# Patient Record
Sex: Male | Born: 1980 | Race: Black or African American | Hispanic: No | Smoking: Current every day smoker
Health system: Southern US, Community
[De-identification: ages and names within clinical notes are randomized; demographics above are authoritative.]

## PROBLEM LIST (undated history)

## (undated) DIAGNOSIS — E785 Hyperlipidemia, unspecified: Secondary | ICD-10-CM

## (undated) DIAGNOSIS — I10 Essential (primary) hypertension: Secondary | ICD-10-CM

## (undated) DIAGNOSIS — K219 Gastro-esophageal reflux disease without esophagitis: Secondary | ICD-10-CM

## (undated) DIAGNOSIS — T7840XA Allergy, unspecified, initial encounter: Secondary | ICD-10-CM

## (undated) HISTORY — PX: ABDOMINAL SURGERY: SHX537

## (undated) HISTORY — DX: Hyperlipidemia, unspecified: E78.5

## (undated) HISTORY — DX: Essential (primary) hypertension: I10

## (undated) HISTORY — DX: Gastro-esophageal reflux disease without esophagitis: K21.9

## (undated) HISTORY — DX: Allergy, unspecified, initial encounter: T78.40XA

---

## 2001-05-02 ENCOUNTER — Emergency Department (HOSPITAL_COMMUNITY): Admission: EM | Admit: 2001-05-02 | Discharge: 2001-05-02 | Payer: Self-pay | Admitting: Emergency Medicine

## 2001-05-02 ENCOUNTER — Encounter: Payer: Self-pay | Admitting: Emergency Medicine

## 2004-06-12 ENCOUNTER — Emergency Department (HOSPITAL_COMMUNITY): Admission: EM | Admit: 2004-06-12 | Discharge: 2004-06-12 | Payer: Self-pay | Admitting: Emergency Medicine

## 2004-06-23 ENCOUNTER — Emergency Department (HOSPITAL_COMMUNITY): Admission: EM | Admit: 2004-06-23 | Discharge: 2004-06-23 | Payer: Self-pay | Admitting: Emergency Medicine

## 2005-11-17 ENCOUNTER — Emergency Department (HOSPITAL_COMMUNITY): Admission: EM | Admit: 2005-11-17 | Discharge: 2005-11-17 | Payer: Self-pay | Admitting: *Deleted

## 2007-08-07 ENCOUNTER — Emergency Department (HOSPITAL_COMMUNITY): Admission: EM | Admit: 2007-08-07 | Discharge: 2007-08-07 | Payer: Self-pay | Admitting: Emergency Medicine

## 2007-08-13 ENCOUNTER — Emergency Department (HOSPITAL_COMMUNITY): Admission: EM | Admit: 2007-08-13 | Discharge: 2007-08-13 | Payer: Self-pay | Admitting: Emergency Medicine

## 2007-09-05 ENCOUNTER — Emergency Department (HOSPITAL_COMMUNITY): Admission: EM | Admit: 2007-09-05 | Discharge: 2007-09-05 | Payer: Self-pay | Admitting: Emergency Medicine

## 2008-06-17 ENCOUNTER — Emergency Department (HOSPITAL_COMMUNITY): Admission: EM | Admit: 2008-06-17 | Discharge: 2008-06-17 | Payer: Self-pay | Admitting: Emergency Medicine

## 2013-12-19 ENCOUNTER — Emergency Department (HOSPITAL_COMMUNITY)
Admission: EM | Admit: 2013-12-19 | Discharge: 2013-12-19 | Disposition: A | Discharge status: Home / Self Care | Payer: No Typology Code available for payment source | Attending: Emergency Medicine | Admitting: Emergency Medicine

## 2013-12-19 ENCOUNTER — Encounter (HOSPITAL_COMMUNITY): Payer: Self-pay | Admitting: Emergency Medicine

## 2013-12-19 ENCOUNTER — Emergency Department (HOSPITAL_COMMUNITY): Payer: No Typology Code available for payment source

## 2013-12-19 DIAGNOSIS — Y92410 Unspecified street and highway as the place of occurrence of the external cause: Secondary | ICD-10-CM | POA: Insufficient documentation

## 2013-12-19 DIAGNOSIS — Y9389 Activity, other specified: Secondary | ICD-10-CM | POA: Insufficient documentation

## 2013-12-19 DIAGNOSIS — IMO0002 Reserved for concepts with insufficient information to code with codable children: Secondary | ICD-10-CM | POA: Insufficient documentation

## 2013-12-19 DIAGNOSIS — Z79899 Other long term (current) drug therapy: Secondary | ICD-10-CM | POA: Insufficient documentation

## 2013-12-19 DIAGNOSIS — F17200 Nicotine dependence, unspecified, uncomplicated: Secondary | ICD-10-CM | POA: Insufficient documentation

## 2013-12-19 MED ORDER — HYDROCODONE-ACETAMINOPHEN 5-325 MG PO TABS: 1.0000 | ORAL_TABLET | ORAL | Status: DC | PRN

## 2013-12-19 MED ORDER — CYCLOBENZAPRINE HCL 10 MG PO TABS: 10.0000 mg | ORAL_TABLET | Freq: Two times a day (BID) | ORAL | Status: DC | PRN

## 2013-12-19 MED ORDER — IBUPROFEN 600 MG PO TABS: 600.0000 mg | ORAL_TABLET | Freq: Four times a day (QID) | ORAL | Status: DC | PRN
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
## 2013-12-20 NOTE — ED Provider Notes (Signed)
Medical screening examination/treatment/procedure(s) were performed by non-physician practitioner and as supervising physician I was immediately available for consultation/collaboration.   EKG Interpretation None        Demitrios Molyneux L Deshonda Cryderman, MD 12/20/13 1350 

## 2014-04-17 ENCOUNTER — Emergency Department (HOSPITAL_COMMUNITY)
Admission: EM | Admit: 2014-04-17 | Discharge: 2014-04-17 | Disposition: A | Discharge status: Home / Self Care | Payer: No Typology Code available for payment source | Attending: Emergency Medicine | Admitting: Emergency Medicine

## 2014-04-17 ENCOUNTER — Encounter (HOSPITAL_COMMUNITY): Payer: Self-pay | Admitting: Emergency Medicine

## 2014-04-17 DIAGNOSIS — M545 Low back pain, unspecified: Secondary | ICD-10-CM | POA: Insufficient documentation

## 2014-04-17 DIAGNOSIS — Z79899 Other long term (current) drug therapy: Secondary | ICD-10-CM | POA: Insufficient documentation

## 2014-04-17 DIAGNOSIS — R52 Pain, unspecified: Secondary | ICD-10-CM | POA: Insufficient documentation

## 2014-04-17 DIAGNOSIS — F17200 Nicotine dependence, unspecified, uncomplicated: Secondary | ICD-10-CM | POA: Insufficient documentation

## 2014-04-17 MED ORDER — CYCLOBENZAPRINE HCL 10 MG PO TABS
10.0000 mg | ORAL_TABLET | Freq: Once | ORAL | Status: AC
  Administered 2014-04-17: 10 mg via ORAL
  Filled 2014-04-17: qty 1

## 2014-04-17 MED ORDER — CYCLOBENZAPRINE HCL 10 MG PO TABS: 10.0000 mg | ORAL_TABLET | Freq: Three times a day (TID) | ORAL | Status: DC | PRN

## 2014-04-17 MED ORDER — OXYCODONE-ACETAMINOPHEN 5-325 MG PO TABS
1.0000 | ORAL_TABLET | Freq: Once | ORAL | Status: AC
  Administered 2014-04-17: 1 via ORAL
  Filled 2014-04-17: qty 1

## 2014-04-17 MED ORDER — OXYCODONE-ACETAMINOPHEN 5-325 MG PO TABS: 1.0000 | ORAL_TABLET | ORAL | Status: DC | PRN

## 2014-04-17 MED ORDER — IBUPROFEN 800 MG PO TABS
800.0000 mg | ORAL_TABLET | Freq: Once | ORAL | Status: AC
  Administered 2014-04-17: 800 mg via ORAL
  Filled 2014-04-17: qty 1

## 2014-04-17 NOTE — Discharge Instructions (Signed)
Take two Naproxen (Aleve) tablets at a time, twice a day.  Back Pain, Adult Low back pain is very common. About 1 in 5 people have back pain.The cause of low back pain is rarely dangerous. The pain often gets better over time.About half of people with a sudden onset of back pain feel better in just 2 weeks. About 8 in 10 people feel better by 6 weeks.  CAUSES Some common causes of back pain include:  Strain of the muscles or ligaments supporting the spine.  Wear and tear (degeneration) of the spinal discs.  Arthritis.  Direct injury to the back. DIAGNOSIS Most of the time, the direct cause of low back pain is not known.However, back pain can be treated effectively even when the exact cause of the pain is unknown.Answering your caregiver's questions about your overall health and symptoms is one of the most accurate ways to make sure the cause of your pain is not dangerous. If your caregiver needs more information, he or she may order lab work or imaging tests (X-rays or MRIs).However, even if imaging tests show changes in your back, this usually does not require surgery. HOME CARE INSTRUCTIONS For many people, back pain returns.Since low back pain is rarely dangerous, it is often a condition that people can learn to [facility] their own.   Remain active. It is stressful on the back to sit or stand in one place. Do not sit, drive, or stand in one place for more than 30 minutes at a time. Take short walks on level surfaces as soon as pain allows.Try to increase the length of time you walk each day.  Do not stay in bed.Resting more than 1 or 2 days can delay your recovery.  Do not avoid exercise or work.Your body is made to move.It is not dangerous to be active, even though your back may hurt.Your back will likely heal faster if you return to being active before your pain is gone.  Pay attention to your body when you bend and lift. Many people have less discomfortwhen lifting if they  bend their knees, keep the load close to their bodies,and avoid twisting. Often, the most comfortable positions are those that put less stress on your recovering back.  Find a comfortable position to sleep. Use a firm mattress and lie on your side with your knees slightly bent. If you lie on your back, put a pillow under your knees.  Only take over-the-counter or prescription medicines as directed by your caregiver. Over-the-counter medicines to reduce pain and inflammation are often the most helpful.Your caregiver may prescribe muscle relaxant drugs.These medicines help dull your pain so you can more quickly return to your normal activities and healthy exercise.  Put ice on the injured area.  Put ice in a plastic bag.  Place a towel between your skin and the bag.  Leave the ice on for 15-20 minutes, 03-04 times a day for the first 2 to 3 days. After that, ice and heat may be alternated to reduce pain and spasms.  Ask your caregiver about trying back exercises and gentle massage. This may be of some benefit.  Avoid feeling anxious or stressed.Stress increases muscle tension and can worsen back pain.It is important to recognize when you are anxious or stressed and learn ways to manage it.Exercise is a great option. SEEK MEDICAL CARE IF:  You have pain that is not relieved with rest or medicine.  You have pain that does not improve in 1 week.  You have new symptoms.  You are generally not feeling well. SEEK IMMEDIATE MEDICAL CARE IF:   You have pain that radiates from your back into your legs.  You develop new bowel or bladder control problems.  You have unusual weakness or numbness in your arms or legs.  You develop nausea or vomiting.  You develop abdominal pain.  You feel faint. Document Released: 09/14/2005 Document Revised: 03/15/2012 Document Reviewed: 02/02/2011 [facility] Patient Information 2015 [place], [place]. This information is not intended to replace advice  given to you by your health care provider. Make sure you discuss any questions you have with your health care provider.  Cyclobenzaprine tablets What is this medicine? CYCLOBENZAPRINE (sye kloe BEN za preen) is a muscle relaxer. It is used to treat muscle pain, spasms, and stiffness. This medicine may be used for other purposes; ask your health care provider or pharmacist if you have questions. COMMON BRAND NAME(S): Fexmid, Flexeril What should I tell my health care provider before I take this medicine? They need to know if you have any of these conditions: -heart disease, irregular heartbeat, or previous heart attack -liver disease -thyroid problem -an unusual or allergic reaction to cyclobenzaprine, tricyclic antidepressants, lactose, other medicines, foods, dyes, or preservatives -pregnant or trying to get pregnant -breast-feeding How should I use this medicine? Take this medicine by mouth with a glass of water. Follow the directions on the prescription label. If this medicine upsets your stomach, take it with food or milk. Take your medicine at regular intervals. Do not take it more often than directed. Talk to your pediatrician regarding the use of this medicine in children. Special care may be needed. Overdosage: If you think you have taken too much of this medicine contact a poison control center or emergency room at once. NOTE: This medicine is only for you. Do not share this medicine with others. What if I miss a dose? If you miss a dose, take it as soon as you can. If it is almost time for your next dose, take only that dose. Do not take double or extra doses. What may interact with this medicine? Do not take this medicine with any of the following medications: -certain medicines for fungal infections like fluconazole, itraconazole, ketoconazole, posaconazole,  voriconazole -cisapride -dofetilide -dronedarone -droperidol -flecainide -grepafloxacin -halofantrine -levomethadyl -MAOIs like Carbex, Eldepryl, Marplan, Nardil, and Parnate -nilotinib -pimozide -probucol -sertindole -thioridazine -ziprasidone This medicine may also interact with the following medications: -abarelix -alcohol -certain medicines for cancer -certain medicines for depression, anxiety, or psychotic disturbances -certain medicines for infection like alfuzosin, chloroquine, clarithromycin, levofloxacin, mefloquine, pentamidine, troleandomycin -certain medicines for an irregular heart beat -certain medicines used for sleep or numbness during surgery or procedure -contrast dyes -dolasetron -guanethidine -methadone -octreotide -ondansetron -other medicines that prolong the QT interval (cause an abnormal heart rhythm) -palonosetron -phenothiazines like chlorpromazine, mesoridazine, prochlorperazine, thioridazine -tramadol -vardenafil This list may not describe all possible interactions. Give your health care provider a list of all the medicines, herbs, non-prescription drugs, or dietary supplements you use. Also tell them if you smoke, drink alcohol, or use illegal drugs. Some items may interact with your medicine. What should I watch for while using this medicine? Check with your doctor or health care professional if your condition does not improve within 1 to 3 weeks. You may get drowsy or dizzy when you first start taking the medicine or change doses. Do not drive, use machinery, or do anything that may be dangerous until you know how the medicine  affects you. Stand or sit up slowly. Your mouth may get dry. Drinking water, chewing sugarless gum, or sucking on hard candy may help. What side effects may I notice from receiving this medicine? Side effects that you should report to your doctor or health care professional as soon as possible: -allergic reactions like  skin rash, itching or hives, swelling of the face, lips, or tongue -chest pain -fast heartbeat -hallucinations -seizures -vomiting Side effects that usually do not require medical attention (report to your doctor or health care professional if they continue or are bothersome): -headache This list may not describe all possible side effects. Call your doctor for medical advice about side effects. You may report side effects to FDA at 1-800-FDA-1088. Where should I keep my medicine? Keep out of the reach of children. Store at room temperature between 15 and 30 degrees C (59 and 86 degrees F). Keep container tightly closed. Throw away any unused medicine after the expiration date. NOTE: This sheet is a summary. It may not cover all possible information. If you have questions about this medicine, talk to your doctor, pharmacist, or health care provider.  2015, Elsevier/Gold Standard. (2013-04-11 12:48:19)  Acetaminophen; Oxycodone tablets What is this medicine? ACETAMINOPHEN; OXYCODONE (a set a MEE noe fen; ox i KOE done) is a pain reliever. It is used to treat mild to moderate pain. This medicine may be used for other purposes; ask your health care provider or pharmacist if you have questions. COMMON BRAND NAME(S): Endocet, Magnacet, Narvox, Percocet, Perloxx, Primalev, Primlev, Roxicet, Xolox What should I tell my health care provider before I take this medicine? They need to know if you have any of these conditions: -brain tumor -Crohn's disease, inflammatory bowel disease, or ulcerative colitis -drug abuse or addiction -head injury -heart or circulation problems -if you often drink alcohol -kidney disease or problems going to the bathroom -liver disease -lung disease, asthma, or breathing problems -an unusual or allergic reaction to acetaminophen, oxycodone, other opioid analgesics, other medicines, foods, dyes, or preservatives -pregnant or trying to get  pregnant -breast-feeding How should I use this medicine? Take this medicine by mouth with a full glass of water. Follow the directions on the prescription label. Take your medicine at regular intervals. Do not take your medicine more often than directed. Talk to your pediatrician regarding the use of this medicine in children. Special care may be needed. Patients over 92 years old may have a stronger reaction and need a smaller dose. Overdosage: If you think you have taken too much of this medicine contact a poison control center or emergency room at once. NOTE: This medicine is only for you. Do not share this medicine with others. What if I miss a dose? If you miss a dose, take it as soon as you can. If it is almost time for your next dose, take only that dose. Do not take double or extra doses. What may interact with this medicine? -alcohol -antihistamines -barbiturates like amobarbital, butalbital, butabarbital, methohexital, pentobarbital, phenobarbital, thiopental, and secobarbital -benztropine -drugs for bladder problems like solifenacin, trospium, oxybutynin, tolterodine, hyoscyamine, and methscopolamine -drugs for breathing problems like ipratropium and tiotropium -drugs for certain stomach or intestine problems like propantheline, homatropine methylbromide, glycopyrrolate, atropine, belladonna, and dicyclomine -general anesthetics like etomidate, ketamine, nitrous oxide, propofol, desflurane, enflurane, halothane, isoflurane, and sevoflurane -medicines for depression, anxiety, or psychotic disturbances -medicines for sleep -muscle relaxants -naltrexone -narcotic medicines (opiates) for pain -phenothiazines like perphenazine, thioridazine, chlorpromazine, mesoridazine, fluphenazine, prochlorperazine, promazine, and trifluoperazine -scopolamine -  tramadol -trihexyphenidyl This list may not describe all possible interactions. Give your health care provider a list of all the  medicines, herbs, non-prescription drugs, or dietary supplements you use. Also tell them if you smoke, drink alcohol, or use illegal drugs. Some items may interact with your medicine. What should I watch for while using this medicine? Tell your doctor or health care professional if your pain does not go away, if it gets worse, or if you have new or a different type of pain. You may develop tolerance to the medicine. Tolerance means that you will need a higher dose of the medication for pain relief. Tolerance is normal and is expected if you take this medicine for a long time. Do not suddenly stop taking your medicine because you may develop a severe reaction. Your body becomes used to the medicine. This does NOT mean you are addicted. Addiction is a behavior related to getting and using a drug for a non-medical reason. If you have pain, you have a medical reason to take pain medicine. Your doctor will tell you how much medicine to take. If your doctor wants you to stop the medicine, the dose will be slowly lowered over time to avoid any side effects. You may get drowsy or dizzy. Do not drive, use machinery, or do anything that needs mental alertness until you know how this medicine affects you. Do not stand or sit up quickly, especially if you are an older patient. This reduces the risk of dizzy or fainting spells. Alcohol may interfere with the effect of this medicine. Avoid alcoholic drinks. There are different types of narcotic medicines (opiates) for pain. If you take more than one type at the same time, you may have more side effects. Give your health care provider a list of all medicines you use. Your doctor will tell you how much medicine to take. Do not take more medicine than directed. Call emergency for help if you have problems breathing. The medicine will cause constipation. Try to have a bowel movement at least every 2 to 3 days. If you do not have a bowel movement for 3 days, call your doctor or  health care professional. Do not take Tylenol (acetaminophen) or medicines that have acetaminophen with this medicine. Too much acetaminophen can be very dangerous. Many nonprescription medicines contain acetaminophen. Always read the labels carefully to avoid taking more acetaminophen. What side effects may I notice from receiving this medicine? Side effects that you should report to your doctor or health care professional as soon as possible: -allergic reactions like skin rash, itching or hives, swelling of the face, lips, or tongue -breathing difficulties, wheezing -confusion -light headedness or fainting spells -severe stomach pain -unusually weak or tired -yellowing of the skin or the whites of the eyes Side effects that usually do not require medical attention (report to your doctor or health care professional if they continue or are bothersome): -dizziness -drowsiness -nausea -vomiting This list may not describe all possible side effects. Call your doctor for medical advice about side effects. You may report side effects to FDA at 1-800-FDA-1088. Where should I keep my medicine? Keep out of the reach of children. This medicine can be abused. Keep your medicine in a safe place to protect it from theft. Do not share this medicine with anyone. Selling or giving away this medicine is dangerous and against the law. Store at room temperature between 20 and 25 degrees C (68 and 77 degrees F). Keep container tightly closed.  Protect from light. This medicine may cause accidental overdose and death if it is taken by other adults, children, or pets. Flush any unused medicine down the toilet to reduce the chance of harm. Do not use the medicine after the expiration date. NOTE: This sheet is a summary. It may not cover all possible information. If you have questions about this medicine, talk to your doctor, pharmacist, or health care provider.  2015, Elsevier/Gold Standard. (2013-05-08  13:17:35)   Emergency Department Resource Guide 1) Find a Doctor and Pay Out of Pocket Although you won't have to find out who is covered by your insurance plan, it is a good idea to ask around and get recommendations. You will then need to call the office and see if the doctor you have chosen will accept you as a new patient and what types of options they offer for patients who are self-pay. Some doctors offer discounts or will set up payment plans for their patients who do not have insurance, but you will need to ask so you aren't surprised when you get to your appointment.  2) Contact Your Local Health Department Not all health departments have doctors that can see patients for sick visits, but many do, so it is worth a call to see if yours does. If you don't know where your local health department is, you can check in your phone book. The CDC also has a tool to help you locate your state's health department, and many state websites also have listings of all of their local health departments.  3) Find a Walk-in Clinic If your illness is not likely to be very severe or complicated, you may want to try a walk in clinic. These are popping up all over the country in pharmacies, drugstores, and shopping centers. They're usually staffed by nurse practitioners or physician assistants that have been trained to treat common illnesses and complaints. They're usually fairly quick and inexpensive. However, if you have serious medical issues or chronic medical problems, these are probably not your best option.  No Primary Care Doctor: - Call Health Connect at  [phone] - they can help you locate a primary care doctor that  accepts your insurance, provides certain services, etc. - Physician Referral [phone]  Medication Assistance: Organization         Address  Phone   Notes  [facility] Medication Assistance Program [address] [place] [place], [place] [address] [phone]  --Must be a resident of [facility] -- Must have NO insurance coverage whatsoever (no Medicaid/ Medicare, etc.) -- The pt. MUST have a primary care doctor that directs their care regularly and follows them in the community   [facility]ist  [phone]   [employer]  [phone]    Agencies that provide inexpensive medical care: Organization         Address  Phone   Notes  [facility]  [phone]   [facility]    [phone]   [facility] [address] [place], [place] [address] [phone]   [facility] [address] [place] [place] [phone]   Planned Parenthood    [phone]   [facility]    [phone]   [facility]  [address], [place] Phone:  [phone], Fax:  [phone] Hours of Operation:  9 am - 6 pm, M-F.  Also accepts Medicaid/Medicare and  self-pay.  [facility] for [place] [address], [address], [place] Phone: [phone], Fax: [phone]. Hours of Operation:  8:30 am - 5:30 pm, M-F.  Also accepts Medicaid and self-pay.  [place] [address], [place] Phone: [phone]   [place], [place], [place] [phone], Ext. [phone] Mondays & Thursdays: 7-9 AM.  First 15 patients are seen on a first come, first serve basis.    [place] Providers:  Organization         Address  Phone   Notes  [facility] [address], [place] [phone] Also accepts self-pay patients.  [facility] [address], [place]  [phone]   [place], [address], [place] [phone]   [facility] [address] [place] [phone]   Lucianne Lei [address], [place]   [phone] Only  accepts [place] Access [place] patients after they have their name applied to their card.   Self-Pay (no insurance) in [facility]:  Organization         Address  Phone   Notes  Sickle Cell Patients, [facility] [place] [phone]   [facility] [place] [phone]   [facility] [place]  [place], [place], [place] [phone]   [facility]/Dr. Osei-Bonsu  [address] [place] or [address], [place] [phone] Phone number for both [place] and [place] locations is the same.  [facility] [address], [place] [phone]   [facility] [address], [place] or [address] [phone]   [facility] [address], [place] [phone], phone; [phone], fax Sees patients 1st and 3rd Saturday of every month.  Must not qualify for public or private insurance (i.e. Medicaid, Medicare, [place] Health Choice, Veterans' Benefits)  Household income should be no more than 200% of the poverty level The clinic cannot treat you if you are pregnant or think you are pregnant  Sexually transmitted diseases are not treated at the clinic.    Dental Care: Organization         Address  Phone  Notes  [facility] Department of [facility] [phone] Accepts children up to age 77 who are enrolled in [place] or [place]; pregnant women with a Medicaid card; and children who have applied for Medicaid or [place] Health Choice, but were declined, whose parents can pay a reduced fee at time of service.  [facility] Department of [facility]  [address], [place] [phone] Accepts children up to age 9 who are enrolled in [place] or [place]; pregnant  women with a Medicaid card; and children who have applied for Medicaid or [place] Health Choice, but were declined, whose parents can pay a reduced fee at time of service.  [place] [facility] PRO[facility]  [place] [phone] Patients are seen by appointment only. Walk-ins are not accepted. [place] will see patients 28 years of age and older. Monday - Tuesday (8am-5pm) Most Wednesdays (8:30-5pm) $  30 per visit, cash only  [facility] Adult [employer] PROGRAM  [address], [facility] [phone] Patients are seen by appointment only. Walk-ins are not accepted. [facility] will see patients 31 years of age and older. One Wednesday Evening (Monthly: Volunteer Based).  $30 per visit, cash only  [employer]  [phone] for adults; Children under age 95, call Graduate Pediatric Dentistry at [phone]. Children aged 95-14, please call [phone] to request a pediatric application.  Dental services are provided in all areas of dental care including fillings, crowns and bridges, complete and partial dentures, implants, gum treatment, root canals, and extractions. Preventive care is also provided. Treatment is provided to both adults and children. Patients are selected via a lottery and there is often a waiting list.   [facility] [address] [place]  [phone] [email or web address]   [facility] [address] [place], [place] [phone], Ext. [phone] Second and Fourth Thursday of each month, opens at 6:30 AM; Clinic ends at 9 AM.  Patients are seen on a first-come first-served basis, and a limited number are seen during each clinic.   [facility]  [address] [place], [place] [phone]   Eligibility Requirements You must have lived in [place], [place], or [place] counties for at least the last three months.   You cannot be eligible for state or federal sponsored  [place], including [employer], [place], or [employer].   You generally cannot be eligible for healthcare insurance through your employer.    How to apply: Eligibility screenings are held every Tuesday and Wednesday afternoon from 1:00 pm until 4:00 pm. You do not need an appointment for the interview!  [facility] [address] [place], [place] [phone]   [facility]  [phone]   [facility] Health Department  [phone]   [facility] Health Department  [phone]

## 2014-04-17 NOTE — ED Provider Notes (Signed)
CSN: [id number]     Arrival date & time 04/17/14  0522 History   First MD Initiated Contact with Patient 04/17/14 [id number]     Chief Complaint  Patient presents with  . Back Pain     (Consider location/radiation/quality/duration/timing/severity/associated sxs/prior Treatment) Patient is a 33 y.o. male presenting with back pain. The history is provided by the patient.  Back Pain He has been having pain in his lower back for the last 3 days. Pain is in the midline but radiates to both hips and small size. Pain is severe and he rates it 10/10. It is worse with any movement or bending. He denies any recent trauma or unusual bending or lifting. He has had back problem for the past but not for several years. He denies bowel or bladder dysfunction. He denies weakness, numbness, tingling. He has tried taking ibuprofen as well as a leftover cyclobenzaprine which have not given him any relief. He was unable to sleep tonight she came to the ED.  History reviewed. No pertinent past medical history. Past Surgical History  Procedure Laterality Date  . Abdominal surgery     History reviewed. No pertinent family history. History  Substance Use Topics  . Smoking status: Current Every Day Smoker    Types: Cigarettes  . Smokeless tobacco: Not on file  . Alcohol Use: No    Review of Systems  Musculoskeletal: Positive for back pain.  All other systems reviewed and are negative.     Allergies  Review of patient's allergies indicates no known allergies.  Home Medications   Prior to Admission medications   Medication Sig Start Date End Date Taking? Authorizing Provider  cetirizine (ZYRTEC) 10 MG tablet Take 10 mg by mouth daily.    Historical Provider, MD  cyclobenzaprine (FLEXERIL) 10 MG tablet Take 1 tablet (10 mg total) by mouth 2 (two) times daily as needed for muscle spasms. 12/19/13   Tiffany Irine Seal, PA-C  cyclobenzaprine (FLEXERIL) 10 MG tablet Take 1 tablet (10 mg total) by mouth 3 (three)  times daily as needed for muscle spasms. 04/17/14   Dione Booze, MD  HYDROcodone-acetaminophen (NORCO/VICODIN) 5-325 MG per tablet Take 1 tablet by mouth every 4 (four) hours as needed. 12/19/13   Tiffany Irine Seal, PA-C  ibuprofen (ADVIL,MOTRIN) 600 MG tablet Take 1 tablet (600 mg total) by mouth every 6 (six) hours as needed. 12/19/13   Tiffany Irine Seal, PA-C  omeprazole (PRILOSEC) 20 MG capsule Take 20 mg by mouth daily.    Historical Provider, MD  oxyCODONE-acetaminophen (PERCOCET/ROXICET) 5-325 MG per tablet Take 1 tablet by mouth every 4 (four) hours as needed. 04/17/14   Dione Booze, MD   BP 115/82  Pulse 99  Temp(Src) 97.8 F (36.6 C) (Oral)  Resp 21  Ht 6' (1.829 m)  Wt 256 lb (116.121 kg)  BMI 34.71 kg/m2  SpO2 99% Physical Exam  Nursing note and vitals reviewed.  33 year old male, resting comfortably and in no acute distress. Vital signs are normal. Oxygen saturation is 99%, which is normal. Head is normocephalic and atraumatic. PERRLA, EOMI. Oropharynx is clear. Neck is nontender and supple without adenopathy or JVD. Back is moderately tender in the mid and lower lumbar area. There is moderate to severe bilateral paralumbar spasm. Straight leg raise is positive bilaterally at 30. There is no CVA tenderness. Lungs are clear without rales, wheezes, or rhonchi. Chest is nontender. Heart has regular rate and rhythm without murmur. Abdomen is soft, flat, nontender without masses  or hepatosplenomegaly and peristalsis is normoactive. Extremities have no cyanosis or edema, full range of motion is present. Skin is warm and dry without rash. Neurologic: Mental status is normal, cranial nerves are intact, there are no motor or sensory deficits.   ED Course  Procedures (including critical care time)  MDM   Final diagnoses:  Acute low back pain    No acute low faxed pain. No evidence of any acute neurologic injury. No indication for imaging studies today. He is discharged with  prescriptions for cyclobenzaprine and oxycodone and acetaminophen as told to use over-the-counter naproxen.    Dione Boozeavid Riely Oetken, MD 04/17/14 0700

## 2014-04-17 NOTE — ED Notes (Signed)
Pt alert & oriented x4, stable gait. Patient given discharge instructions, paperwork & prescription(s). Patient  instructed to stop at the registration desk to finish any additional paperwork. Patient verbalized understanding. Pt left department w/ no further questions. 

## 2014-04-17 NOTE — ED Notes (Signed)
Pt c/o lower back pain since Sat.

## 2016-02-07 ENCOUNTER — Other Ambulatory Visit (HOSPITAL_COMMUNITY): Payer: Self-pay | Admitting: Family

## 2016-02-07 DIAGNOSIS — M545 Low back pain, unspecified: Secondary | ICD-10-CM

## 2016-02-14 ENCOUNTER — Ambulatory Visit (HOSPITAL_COMMUNITY)
Admission: RE | Admit: 2016-02-14 | Discharge: 2016-02-14 | Disposition: A | Discharge status: Home / Self Care | Payer: No Typology Code available for payment source | Source: Ambulatory Visit | Attending: Family | Admitting: Family

## 2016-02-14 DIAGNOSIS — M545 Low back pain, unspecified: Secondary | ICD-10-CM

## 2016-02-14 DIAGNOSIS — M4806 Spinal stenosis, lumbar region: Secondary | ICD-10-CM | POA: Insufficient documentation

## 2016-02-14 DIAGNOSIS — M5441 Lumbago with sciatica, right side: Secondary | ICD-10-CM | POA: Insufficient documentation

## 2016-02-14 DIAGNOSIS — M5137 Other intervertebral disc degeneration, lumbosacral region: Secondary | ICD-10-CM | POA: Insufficient documentation

## 2016-02-14 DIAGNOSIS — M5442 Lumbago with sciatica, left side: Secondary | ICD-10-CM | POA: Insufficient documentation

## 2016-02-14 DIAGNOSIS — M5127 Other intervertebral disc displacement, lumbosacral region: Secondary | ICD-10-CM | POA: Insufficient documentation

## 2017-08-20 ENCOUNTER — Emergency Department (HOSPITAL_COMMUNITY)
Admission: EM | Admit: 2017-08-20 | Discharge: 2017-08-20 | Disposition: A | Discharge status: Home / Self Care | Payer: BLUE CROSS/BLUE SHIELD | Attending: Emergency Medicine | Admitting: Emergency Medicine

## 2017-08-20 ENCOUNTER — Other Ambulatory Visit: Payer: Self-pay

## 2017-08-20 ENCOUNTER — Encounter (HOSPITAL_COMMUNITY): Payer: Self-pay | Admitting: Emergency Medicine

## 2017-08-20 ENCOUNTER — Emergency Department (HOSPITAL_COMMUNITY): Payer: BLUE CROSS/BLUE SHIELD

## 2017-08-20 DIAGNOSIS — Z79899 Other long term (current) drug therapy: Secondary | ICD-10-CM | POA: Diagnosis not present

## 2017-08-20 DIAGNOSIS — Y9389 Activity, other specified: Secondary | ICD-10-CM | POA: Diagnosis not present

## 2017-08-20 DIAGNOSIS — W25XXXA Contact with sharp glass, initial encounter: Secondary | ICD-10-CM | POA: Insufficient documentation

## 2017-08-20 DIAGNOSIS — Y998 Other external cause status: Secondary | ICD-10-CM | POA: Insufficient documentation

## 2017-08-20 DIAGNOSIS — S61411A Laceration without foreign body of right hand, initial encounter: Secondary | ICD-10-CM | POA: Diagnosis present

## 2017-08-20 DIAGNOSIS — S66821A Laceration of other specified muscles, fascia and tendons at wrist and hand level, right hand, initial encounter: Secondary | ICD-10-CM | POA: Insufficient documentation

## 2017-08-20 DIAGNOSIS — F17210 Nicotine dependence, cigarettes, uncomplicated: Secondary | ICD-10-CM | POA: Insufficient documentation

## 2017-08-20 DIAGNOSIS — Y929 Unspecified place or not applicable: Secondary | ICD-10-CM | POA: Insufficient documentation

## 2017-08-20 MED ORDER — LIDOCAINE HCL 2 % IJ SOLN
10.0000 mL | Freq: Once | INTRAMUSCULAR | Status: AC
  Administered 2017-08-20: 200 mg
  Filled 2017-08-20: qty 20

## 2017-08-20 MED ORDER — IBUPROFEN 400 MG PO TABS
600.0000 mg | ORAL_TABLET | Freq: Once | ORAL | Status: AC
  Administered 2017-08-20: 600 mg via ORAL
  Filled 2017-08-20: qty 1

## 2017-08-20 MED ORDER — LIDOCAINE HCL 2 % IJ SOLN
15.0000 mL | Freq: Once | INTRAMUSCULAR | Status: DC
  Filled 2017-08-20: qty 20

## 2017-08-20 MED ORDER — SULFAMETHOXAZOLE-TRIMETHOPRIM 800-160 MG PO TABS
1.0000 | ORAL_TABLET | Freq: Two times a day (BID) | ORAL | 0 refills | Status: AC
Start: 2017-08-20 — End: 2017-08-27

## 2017-08-20 NOTE — ED Provider Notes (Signed)
[facility] EMERGENCY DEPARTMENT Provider Note   CSN: [id number] Arrival date & time: 08/20/17  8295     History   Chief Complaint Chief Complaint  Patient presents with  . Extremity Laceration    HPI Shane Pena is a 36 y.o. male.  HPI   Patient is a 36 year old male with no significant past medical history presenting for laceration to the dorsum of right hand.  Additionally, patient had a laceration to the flexor surface of the right little finger, and some abrasions to the left forearm.  Patient reports that this occurred around 1030 or 11 PM last night.  Patient reports he was running at a family gathering and tripped over a cord and his hand went through a window.  Patient soaked his hand immediately afterwards.  Ibuprofen and Tylenol for the pain.  Patient reports he never lost sensation to the distal fingertips of the right hand.  Tetanus is up-to-date per patient's work place 2 years ago. Patient is not diabetic.  History reviewed. No pertinent past medical history.  There are no active problems to display for this patient.   Past Surgical History:  Procedure Laterality Date  . ABDOMINAL SURGERY         Home Medications    Prior to Admission medications   Medication Sig Start Date End Date Taking? Authorizing Provider  cetirizine (ZYRTEC) 10 MG tablet Take 10 mg by mouth daily.    [provider]  cyclobenzaprine (FLEXERIL) 10 MG tablet Take 1 tablet (10 mg total) by mouth 2 (two) times daily as needed for muscle spasms. 12/19/13   Marlon Pel, PA-C  cyclobenzaprine (FLEXERIL) 10 MG tablet Take 1 tablet (10 mg total) by mouth 3 (three) times daily as needed for muscle spasms. 04/17/14   Dione Booze, MD  HYDROcodone-acetaminophen (NORCO/VICODIN) 5-325 MG per tablet Take 1 tablet by mouth every 4 (four) hours as needed. 12/19/13   Marlon Pel, PA-C  ibuprofen (ADVIL,MOTRIN) 600 MG tablet Take 1 tablet (600 mg total) by mouth  every 6 (six) hours as needed. 12/19/13   Marlon Pel, PA-C  omeprazole (PRILOSEC) 20 MG capsule Take 20 mg by mouth daily.    [provider]  oxyCODONE-acetaminophen (PERCOCET/ROXICET) 5-325 MG per tablet Take 1 tablet by mouth every 4 (four) hours as needed. 04/17/14   Dione Booze, MD  sulfamethoxazole-trimethoprim (BACTRIM DS,SEPTRA DS) 800-160 MG tablet Take 1 tablet by mouth 2 (two) times daily for 7 days. 08/20/17 08/27/17  Elisha Ponder, PA-C    Family History History reviewed. No pertinent family history.  Social History Social History   Tobacco Use  . Smoking status: Current Every Day Smoker    Types: Cigarettes  . Smokeless tobacco: Never Used  Substance Use Topics  . Alcohol use: No  . Drug use: No     Allergies   Patient has no known allergies.   Review of Systems Review of Systems  Gastrointestinal: Negative for nausea and vomiting.  Skin: Positive for wound.  Neurological: Negative for syncope, weakness and numbness.     Physical Exam Updated Vital Signs BP (!) 137/92 (BP Location: Left Arm)   Pulse 82   Temp 98.5 F (36.9 C) (Oral)   Resp 18   Ht 6' (1.829 m)   Wt 108.9 kg (240 lb)   SpO2 98%   BMI 32.55 kg/m   Physical Exam  Constitutional: He appears well-developed and well-nourished. No distress.  Sitting comfortably in bed.  HENT:  Head:  Normocephalic and atraumatic.  Eyes: Conjunctivae are normal. Right eye exhibits no discharge. Left eye exhibits no discharge.  EOMs normal to gross examination.  Neck: Normal range of motion.  Cardiovascular: Normal rate and regular rhythm.  Intact, 2+ radial and ulnar pulse on right.  Pulmonary/Chest:  Normal respiratory effort. Patient converses comfortably. No audible wheeze or stridor.  Abdominal: He exhibits no distension.  Musculoskeletal: Normal range of motion.  Hand Exam:  Inspection: No swelling, deformity.  There is a wound to the dorsal surface of the right hand extending  down to the tendon layer over the third extensor tendon.  There is a proximally 1 cm horizontal laceration over the flexor surface of the right fifth digit. Palpation: No point tenderness, crepitus, tenderness over anatomic snuffbox, or scapholunate joint tenderness ROM: Passive/active ROM intact at wrist, MCP, PIP, and DIP joints, thumb MCP and IP joints, and no rotational deformity of metacarpals noted. Ligamentous stability: No laxity to valgus/varus stress of MCP, PIP, or DIP joints. No joint laxity with radial stress of thumb. Flexor/Extensor tendons: FDS/FDP tendons intact in digits 2-4 at PIP/DIP joints, respectively; extensor tendons intact in all digits. Right 5th digit exhibits an extensor tendon deformity that patient reports is chronic. Nerve testing:  -Radial: Thumb/wrist extension intact and 5/5 strength with resistance. Sensation to light touch intact at dorsal CMC joint. -Median: Thumb opposition intact and 5/5 strength with resistance. Sensation to light touch intact on palmar side of 1st and 2nd digits. -Ulnar: Thumb adduction intact and 5/5 strength with resistance. Sensation to light touch intact on palmar side of 5th digit.  Vascular: 2+ radial and ulnar pulses. Capillary refill <2 seconds b/l.  Neurological: He is alert.  Cranial nerves intact to gross observation. Patient moves extremities without difficulty.  Skin: Skin is warm and dry. He is not diaphoretic.  Psychiatric: He has a normal mood and affect. His behavior is normal. Judgment and thought content normal.  Nursing note and vitals reviewed.          ED Treatments / Results  Labs (all labs ordered are listed, but only abnormal results are displayed) Labs Reviewed - No data to display  EKG  EKG Interpretation None       Radiology Dg Hand Complete Right  Result Date: 08/20/2017 CLINICAL DATA:  36 year old who tripped and fell, lacerating the right hand on a glass door. Initial encounter. EXAM:  RIGHT HAND - COMPLETE 3+ VIEW COMPARISON:  None. FINDINGS: Soft tissue injury dorsally overlying the fifth metacarpal, with gas in the soft tissues related to the laceration. Adjacent very small opaque foreign bodies in the soft tissues at the laceration. No evidence of acute fracture or dislocation. Well-preserved joint spaces. Well-preserved bone mineral density. Accessory ossicle, the os styloideum, adjacent to the ulnar styloid at the wrist. IMPRESSION: 1. No osseous abnormality. 2. Soft tissue laceration dorsally with very small opaque foreign bodies in the soft tissues at the laceration. Electronically Signed   By: Hulan Saashomas  Lawrence M.D.   On: 08/20/2017 11:21    Procedures .Marland Kitchen.Laceration Repair Date/Time: 08/20/2017 7:05 PM Performed by: Elisha PonderMurray, Johnice Riebe B, PA-C Authorized by: Elisha PonderMurray, Juliauna Stueve B, PA-C   Consent:    Consent obtained:  Verbal   Consent given by:  Patient   Risks discussed:  Infection, poor cosmetic result, retained foreign body and need for additional repair Anesthesia (see MAR for exact dosages):    Anesthesia method:  Local infiltration   Local anesthetic:  Lidocaine 2% w/o epi Laceration details:  Location:  Hand (Right dorsal hand and flexor surface of right fifth digit.)   Hand location:  R hand, dorsum   Wound length (cm): See images in chart. Repair type:    Repair type:  Simple Pre-procedure details:    Preparation:  Patient was prepped and draped in usual sterile fashion and imaging obtained to evaluate for foreign bodies Exploration:    Hemostasis achieved with:  Direct pressure   Wound exploration: wound explored through full range of motion and entire depth of wound probed and visualized     Wound extent: areolar tissue violated and fascia violated     Contaminated: no   Treatment:    Area cleansed with:  Betadine   Amount of cleaning:  Extensive   Irrigation solution:  Sterile saline   Irrigation volume:  1500 ml   Irrigation method:  Syringe Skin  repair:    Repair method:  Sutures   Suture size:  4-0   Suture material:  Nylon   Suture technique: Simple interrupted and half buried horizontal mattress.    Number of sutures: 7, 5 on the dorsal hand and 2 on the flexor surface of 5th digit. Approximation:    Approximation:  Loose Post-procedure details:    Dressing: Xeroform gauze, 4x4 gauze, and Coban wrap.   Patient tolerance of procedure:  Tolerated well, no immediate complications   (including critical care time)  Medications Ordered in ED Medications  lidocaine (XYLOCAINE) 2 % (with pres) injection 300 mg (not administered)  ibuprofen (ADVIL,MOTRIN) tablet 600 mg (600 mg Oral Given 08/20/17 1132)  lidocaine (XYLOCAINE) 2 % (with pres) injection 200 mg (200 mg Infiltration Given 08/20/17 1412)     Initial Impression / Assessment and Plan / ED Course  I have reviewed the triage vital signs and the nursing notes.  Pertinent labs & imaging results that were available during my care of the patient were reviewed by me and considered in my medical decision making (see chart for details).     Final Clinical Impressions(s) / ED Diagnoses   Final diagnoses:  Laceration of right hand without foreign body, initial encounter   Patient is neurovascularly intact in right hand distal to injury. Xray shows no bony abnormality. Radiopaque foreign body identified on the ulnar surface of right fifth digit and does not clinically correlate with wound. Patient has a history of fifth metacarpal fracture and I suspect that this is an osseous foreign body. This was discussed with patient. I discussed with patient that loose approximation is warranted due to tendon exposure and tattoo overlying wound may need additional repair.  Case discussed with Dr. Merlyn Lot in hand surgery by phone, who evaluated the images of the lacerations. Recommended loose approximation of skin over exposed tendon and approximation of flaps. Half buried horizontal mattress  sutures placed over flaps and tendon exposure covered with simple interrupted sutures. Bactrim for antibiotic prophylaxis per Dr. Merrilee Seashore recommendations. I personally dressed wounds with Xeroform and explained to patient how to perform dressing changes. Patient to follow up with Dr. Merlyn Lot next week. Return precautions given for any erythema, drainage, or changes in neurologic sensation. Patient is in understanding and agrees with the plan of care.    This is a shared visit with Dr. Gerhard Munch. Patient was independently evaluated by this attending physician. Attending physician consulted in evaluation and discharge management.   A needle stick injury occurred in the course of laceration repair. Needle did not contact patient after contacting this writer's glove. All standard  protocols followed. Immediate procurement of new sterile materials occurred. Patient was source for blood exposure to staff member. Per hospital policy blood was drawn for exposure panel.   ED Discharge Orders        Ordered    sulfamethoxazole-trimethoprim (BACTRIM DS,SEPTRA DS) 800-160 MG tablet  2 times daily     08/20/17 1635       Delia ChimesMurray, Seiya Silsby B, PA-C 08/20/17 1910    Gerhard MunchLockwood, Iam, MD 08/21/17 [phone]

## 2017-08-20 NOTE — ED Notes (Signed)
Luisa HartPatrick NT drew labs r/t staff exposure

## 2017-08-20 NOTE — ED Notes (Signed)
ED Provider at bedside. 

## 2017-08-20 NOTE — Discharge Instructions (Signed)
Please see the information and instructions below regarding your visit.  Your diagnoses today include:  1. Laceration of right hand without foreign body, initial encounter    Your exam is reassuring today.  Tests performed today include: X-ray of the affected area that did not show any foreign bodies or broken bones Vital signs. See below for your results today.   Medications prescribed:   Take any prescribed medications only as directed.  Ibuprofen alternating with Tylenol for pain.   Please take Bactrim twice a day for one week. Please take all of your antibiotics until finished.   You may develop abdominal discomfort or nausea from the antibiotic. If this occurs, you may take it with food. Some patients also get diarrhea with antibiotics. You may help offset this with probiotics which you can buy or get in yogurt. Do not eat or take the probiotics until 2 hours after your antibiotic.   Some people develop allergies to antibiotics. Symptoms of antibiotic allergy can be mild and include a flat rash and itching. They can also be more serious and include:  ?Hives - Hives are raised, red patches of skin that are usually very itchy.  ?Lip or tongue swelling  ?Trouble swallowing or breathing  ?Blistering of the skin or mouth.  If you have any of these serious symptoms, please seek emergency medical care immediately.   Home care instructions:  Follow any educational materials and wound care instructions contained in this packet.   Keep affected area above the level of your heart when possible to minimize swelling. Wash area gently twice a day with warm soapy water. Do not apply alcohol or hydrogen peroxide directly over a wound. Cover the area if it is draining or weeping. Keep the bandage in place for 24 hours and refrain from getting the wound wet for 24 hours. After that, you may get the area wet, but please ensure that you dry it completely afterwards.  Please refrain from soaking  sutures for long periods of time, or swimming in chlorinated water   You may apply antibiotic ointment such as Bacitracin or Neosporin.  Follow-up instructions: Suture Removal: Return to the Emergency Department or see your primary care care doctor in 7-10  days for a recheck of your wound and removal of your sutures or staples.    Dr. Merlyn LotKuzma can also take out your sutures.  Return instructions:  Return to the Emergency Department if you have: Fever Worsening pain Worsening swelling of the wound Pus draining from the wound Redness of the skin that moves away from the wound, especially if it streaks away from the affected area  Any other emergent concerns  Your vital signs today were: BP (!) 148/90 (BP Location: Left Arm)    Pulse 87    Temp 98.5 F (36.9 C) (Oral)    Resp 16    Ht 6' (1.829 m)    Wt 108.9 kg (240 lb)    SpO2 100%    BMI 32.55 kg/m  If your blood pressure (BP) was elevated on multiple readings during this visit above 130 for the top number or above 80 for the bottom number, please have this repeated by your primary care provider within one month. --------------  Thank you for allowing us to participate in your care today! It was a pleasure taking care of you.

## 2017-08-20 NOTE — ED Notes (Addendum)
Pt was source of incident when PA was stuck preforming sutures. Blood panel will be drawn. Needle thrown away prior to touching pt after staff member was stuck.

## 2017-08-20 NOTE — ED Triage Notes (Addendum)
Pt has laceration to top of right hand, put hand through window, lac in "W" shape to top of hand, small laceration to little finger, laceration to forearm, not bleeding at present. Hand rebandaged in triage.

## 2017-08-20 NOTE — ED Notes (Signed)
Pt reports soaking his hand in hydrogen peroxide last night and controlling bleeding with gauze.

## 2017-08-21 ENCOUNTER — Emergency Department (HOSPITAL_COMMUNITY)
Admission: EM | Admit: 2017-08-21 | Discharge: 2017-08-21 | Disposition: A | Discharge status: Home / Self Care | Payer: BLUE CROSS/BLUE SHIELD | Attending: Emergency Medicine | Admitting: Emergency Medicine

## 2017-08-21 ENCOUNTER — Encounter (HOSPITAL_COMMUNITY): Payer: Self-pay | Admitting: Cardiology

## 2017-08-21 DIAGNOSIS — F17210 Nicotine dependence, cigarettes, uncomplicated: Secondary | ICD-10-CM | POA: Diagnosis not present

## 2017-08-21 DIAGNOSIS — Z79899 Other long term (current) drug therapy: Secondary | ICD-10-CM | POA: Diagnosis not present

## 2017-08-21 DIAGNOSIS — M79641 Pain in right hand: Secondary | ICD-10-CM | POA: Diagnosis present

## 2017-08-21 MED ORDER — OXYCODONE-ACETAMINOPHEN 5-325 MG PO TABS: 1.0000 | ORAL_TABLET | Freq: Four times a day (QID) | ORAL | 0 refills | Status: DC | PRN

## 2017-08-21 NOTE — ED Notes (Signed)
Wound care complete, hand wrapped with kerlix. KErlix given to patient

## 2017-08-21 NOTE — ED Triage Notes (Signed)
[facility].  States he did not get any pain medication and states he needs something for pain.

## 2017-08-21 NOTE — ED Provider Notes (Signed)
[facility] Provider Note   CSN: [id number] Arrival date & time: 08/21/17  0707     History   Chief Complaint Chief Complaint  Patient presents with  . Wound Check    HPI Shane Pena is a 36 y.o. male.  HPI Patient presents 1 day after being evaluated for hand injury, now with concern of pain in the hand. He denies interval fever, chills, discharge, erythema, loss of sensation in his fingertips or anywhere else. He notes that since discharge the pain medication provided has run out, and he has not had sufficient control with OTC medication.    History reviewed. No pertinent past medical history.  There are no active problems to display for this patient.   Past Surgical History:  Procedure Laterality Date  . ABDOMINAL SURGERY         Home Medications    Prior to Admission medications   Medication Sig Start Date End Date Taking? Authorizing Provider  cetirizine (ZYRTEC) 10 MG tablet Take 10 mg by mouth daily.    [provider]  cyclobenzaprine (FLEXERIL) 10 MG tablet Take 1 tablet (10 mg total) by mouth 2 (two) times daily as needed for muscle spasms. 12/19/13   Marlon PelGreene, Tiffany, PA-C  cyclobenzaprine (FLEXERIL) 10 MG tablet Take 1 tablet (10 mg total) by mouth 3 (three) times daily as needed for muscle spasms. 04/17/14   Dione BoozeGlick, David, MD  HYDROcodone-acetaminophen (NORCO/VICODIN) 5-325 MG per tablet Take 1 tablet by mouth every 4 (four) hours as needed. 12/19/13   Marlon PelGreene, Tiffany, PA-C  ibuprofen (ADVIL,MOTRIN) 600 MG tablet Take 1 tablet (600 mg total) by mouth every 6 (six) hours as needed. 12/19/13   Marlon PelGreene, Tiffany, PA-C  omeprazole (PRILOSEC) 20 MG capsule Take 20 mg by mouth daily.    [provider]  oxyCODONE-acetaminophen (PERCOCET/ROXICET) 5-325 MG tablet Take 1 tablet by mouth every 6 (six) hours as needed. 08/21/17   Gerhard MunchLockwood, Deng, MD  sulfamethoxazole-trimethoprim (BACTRIM DS,SEPTRA DS) 800-160 MG tablet Take  1 tablet by mouth 2 (two) times daily for 7 days. 08/20/17 08/27/17  Elisha PonderMurray, Alyssa B, PA-C    Family History History reviewed. No pertinent family history.  Social History Social History   Tobacco Use  . Smoking status: Current Every Day Smoker    Types: Cigarettes  . Smokeless tobacco: Never Used  Substance Use Topics  . Alcohol use: No  . Drug use: No     Allergies   Patient has no known allergies.   Review of Systems Review of Systems  Constitutional: Negative for fever.  Gastrointestinal: Negative for nausea and vomiting.  Musculoskeletal:       Negative aside from HPI  Skin:       Negative aside from HPI  Allergic/Immunologic: Negative for immunocompromised state.  Neurological: Negative for weakness.     Physical Exam Updated Vital Signs BP 134/74   Pulse 90   Temp 98.3 F (36.8 C) (Oral)   Resp 16   Ht 6' (1.829 m)   Wt 108.9 kg (240 lb)   SpO2 98%   BMI 32.55 kg/m   Physical Exam  Constitutional: He is oriented to person, place, and time. He appears well-developed and well-nourished. No distress.  Cardiovascular: Normal rate, regular rhythm and intact distal pulses.  Musculoskeletal:  Hand injury sustained yesterday, repaired by the physician assistant with my supervision appears well-healing, and though there is loose approximation, there is no obvious dehiscence, no spreading erythema, no discharge, no bleeding. Patient moves  all digits appropriately in flexion and extension.  Neurological: He is alert and oriented to person, place, and time.  Skin: Skin is warm and dry. He is not diaphoretic.  Wound as above, we will documented with pictures yesterday  Psychiatric: He has a normal mood and affect.  Nursing note and vitals reviewed.    ED Treatments / Results   Procedures Procedures (including critical care time)  Medications Ordered in ED Medications - No data to display   Initial Impression / Assessment and Plan / ED Course  I have  reviewed the triage vital signs and the nursing notes.  Pertinent labs & imaging results that were available during my care of the patient were reviewed by me and considered in my medical decision making (see chart for details).  Patient presents 1 day after being evaluated for hand wound, having successful repair in the emergency department performed by the physician assistant, with whom I was working. I saw the patient yesterday, and today he appears in similar condition overall, with no evidence for distress, no evidence for wound infection, no evidence for bacteremia or sepsis. Patient's attempted pain control with OTC medication was insufficient, and given the complex deep nature of the wound, additional analgesia with low-dose narcotic, for short time, pending and surgery follow-up is reasonable.   Final Clinical Impressions(s) / ED Diagnoses   Final diagnoses:  Pain of right hand    ED Discharge Orders        Ordered    oxyCODONE-acetaminophen (PERCOCET/ROXICET) 5-325 MG tablet  Every 6 hours PRN     08/21/17 0738       Gerhard MunchLockwood, [name], MD 08/21/17 [phone]

## 2017-08-21 NOTE — Discharge Instructions (Signed)
As discussed, your evaluation today has been largely reassuring.  But, it is important that you monitor your condition carefully, and do not hesitate to return to the ED if you develop new, or concerning changes in your condition.  Pleas unwrap the hand tomorrow, and if it appears in a similar condition to our evaluation today, replace the dressing.  Otherwise, please follow-up with our hand surgeon for appropriate ongoing care.

## 2018-07-23 IMAGING — DX DG HAND COMPLETE 3+V*R*
3 series · 3 of 3 positions shown · non-contrast
Comparison: None.

CLINICAL DATA: 36-year-old who tripped and fell, lacerating the
right hand on a glass door. Initial encounter.

EXAM:
RIGHT HAND - COMPLETE 3+ VIEW

[hand pa]
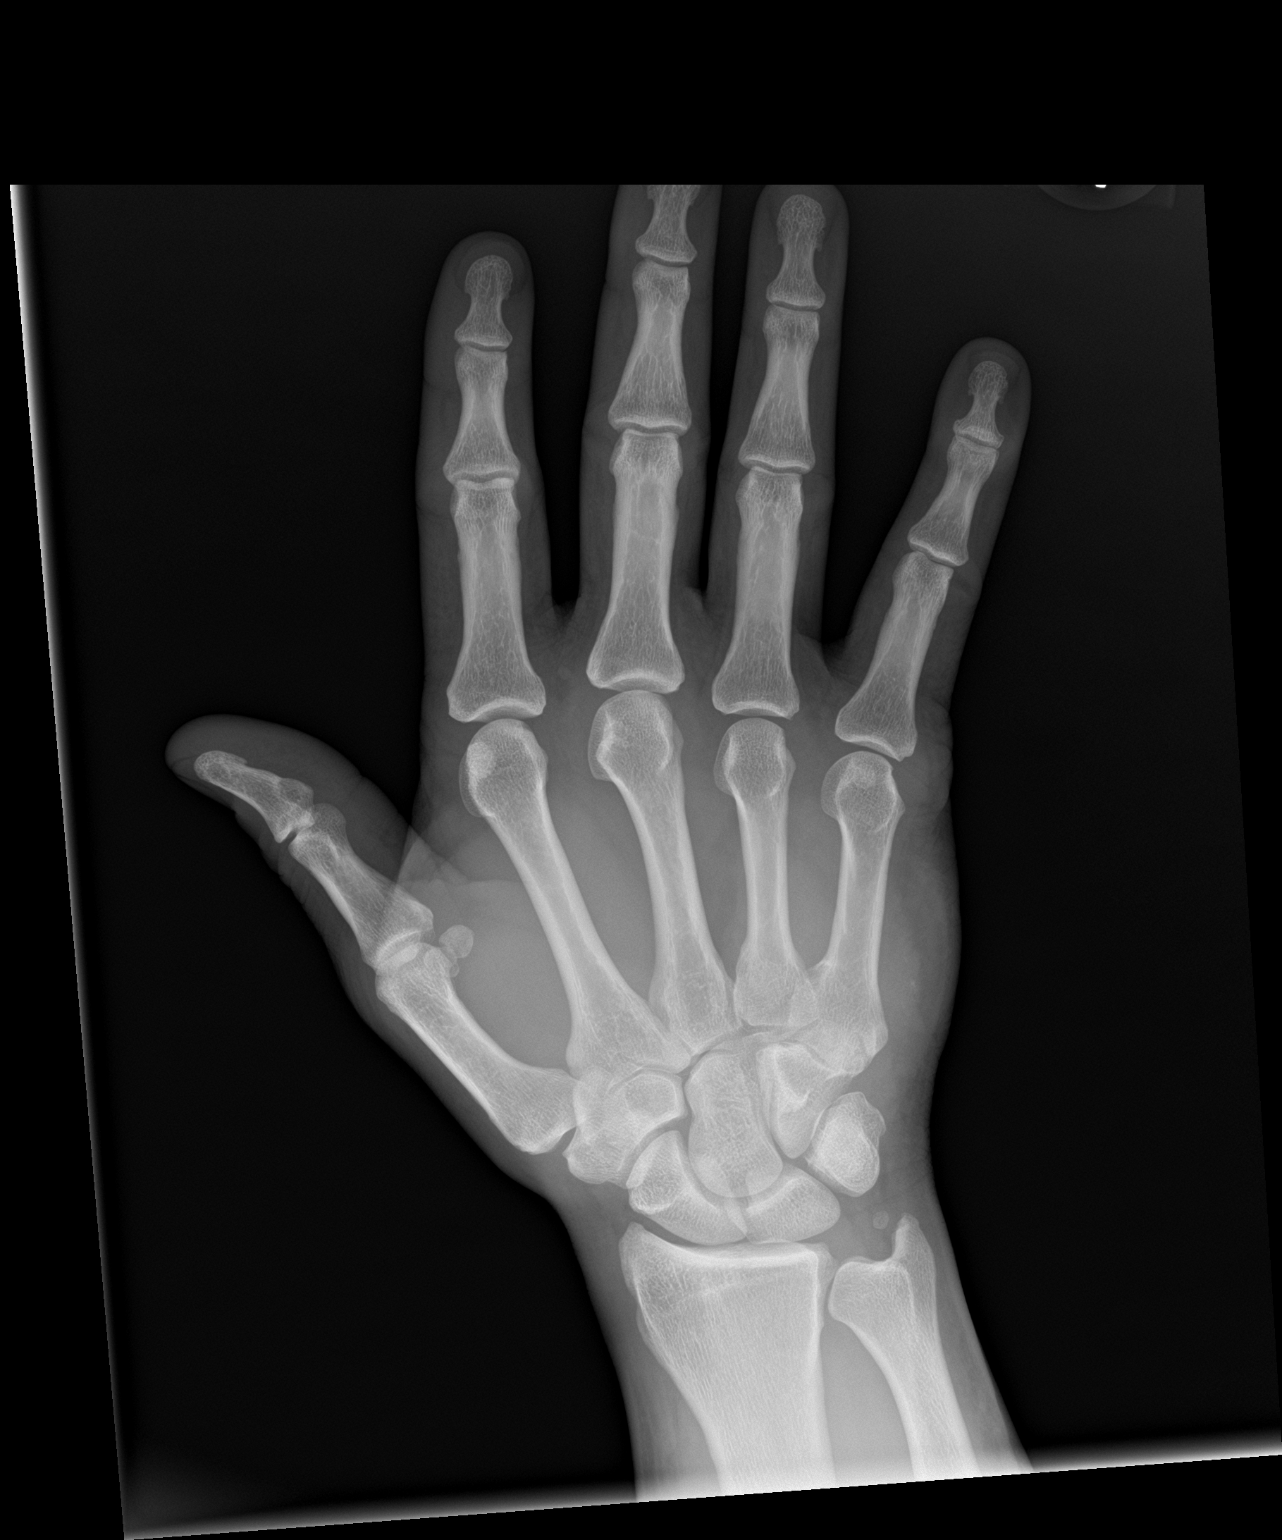

[hand obl]
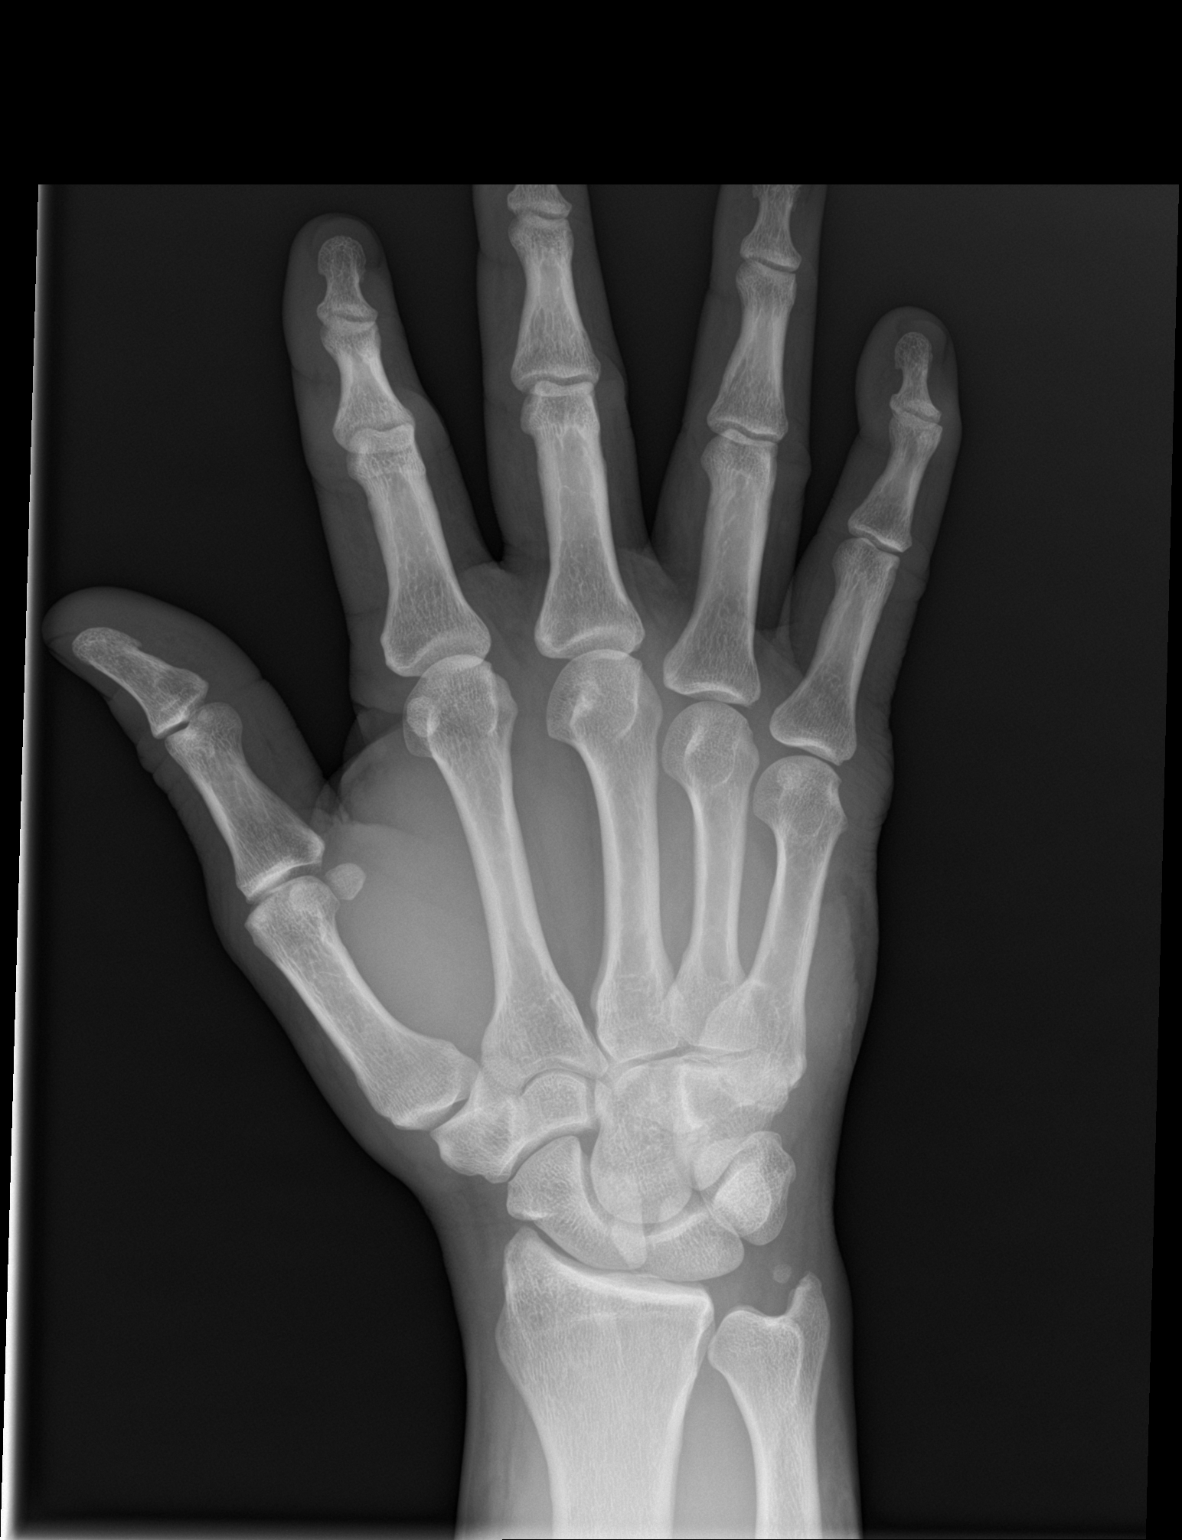

[hand lat]
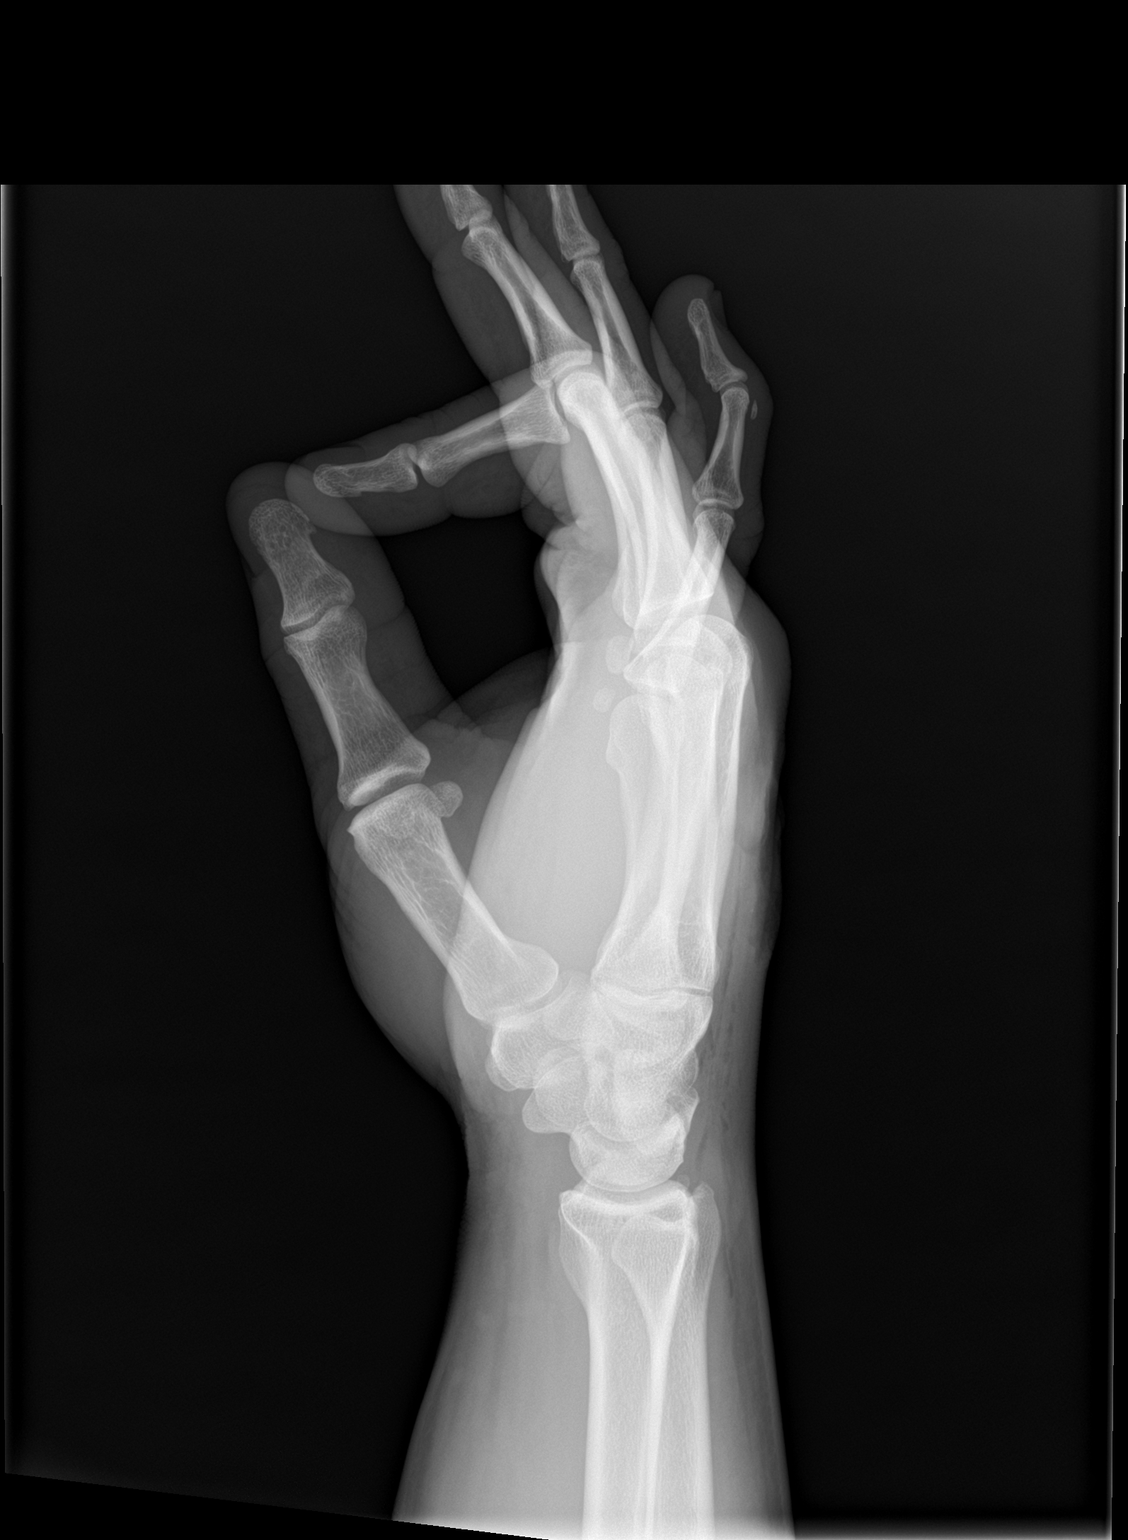

[3 of 3 positions shown; findings below may reference images not displayed]

FINDINGS: Soft tissue injury dorsally overlying the fifth metacarpal, with gas
in the soft tissues related to the laceration. Adjacent very small
opaque foreign bodies in the soft tissues at the laceration.

No evidence of acute fracture or dislocation. Well-preserved joint
spaces. Well-preserved bone mineral density. Accessory ossicle, the
os styloideum, adjacent to the ulnar styloid at the wrist.
IMPRESSION: 1. No osseous abnormality.
2. Soft tissue laceration dorsally with very small opaque foreign
bodies in the soft tissues at the laceration.

## 2018-11-03 DIAGNOSIS — J069 Acute upper respiratory infection, unspecified: Secondary | ICD-10-CM | POA: Diagnosis not present

## 2019-04-05 ENCOUNTER — Other Ambulatory Visit: Payer: Self-pay

## 2019-04-05 ENCOUNTER — Other Ambulatory Visit: Payer: BLUE CROSS/BLUE SHIELD

## 2019-04-05 DIAGNOSIS — R6889 Other general symptoms and signs: Secondary | ICD-10-CM | POA: Diagnosis not present

## 2019-04-05 DIAGNOSIS — Z20822 Contact with and (suspected) exposure to covid-19: Secondary | ICD-10-CM

## 2019-04-10 LAB — NOVEL CORONAVIRUS, NAA: SARS-CoV-2, NAA: DETECTED — AB

## 2019-04-20 ENCOUNTER — Other Ambulatory Visit: Payer: Self-pay

## 2019-04-20 ENCOUNTER — Other Ambulatory Visit: Payer: BC Managed Care – PPO

## 2019-04-20 DIAGNOSIS — R6889 Other general symptoms and signs: Secondary | ICD-10-CM | POA: Diagnosis not present

## 2019-04-20 DIAGNOSIS — Z20822 Contact with and (suspected) exposure to covid-19: Secondary | ICD-10-CM

## 2019-04-23 LAB — NOVEL CORONAVIRUS, NAA: SARS-CoV-2, NAA: NOT DETECTED

## 2019-04-27 ENCOUNTER — Ambulatory Visit: Payer: Self-pay | Admitting: *Deleted

## 2019-04-27 NOTE — Telephone Encounter (Signed)
Pt called in and was given his COVID-19 test result.   It was negative meaning he did not have the virus.    I see where he had it done 3 wks ago and it was positive.    He had to be retested for his job.    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

## 2019-12-08 ENCOUNTER — Encounter: Payer: Self-pay | Admitting: Family Medicine

## 2019-12-08 ENCOUNTER — Ambulatory Visit: Payer: BC Managed Care – PPO | Admitting: Family Medicine

## 2019-12-08 ENCOUNTER — Other Ambulatory Visit: Payer: Self-pay

## 2019-12-08 VITALS — BP 122/86 | HR 83 | Temp 97.8°F | Resp 15 | Ht 72.0 in | Wt 260.0 lb

## 2019-12-08 DIAGNOSIS — K219 Gastro-esophageal reflux disease without esophagitis: Secondary | ICD-10-CM | POA: Diagnosis not present

## 2019-12-08 DIAGNOSIS — Z833 Family history of diabetes mellitus: Secondary | ICD-10-CM | POA: Diagnosis not present

## 2019-12-08 DIAGNOSIS — E669 Obesity, unspecified: Secondary | ICD-10-CM | POA: Diagnosis not present

## 2019-12-08 DIAGNOSIS — R7301 Impaired fasting glucose: Secondary | ICD-10-CM | POA: Diagnosis not present

## 2019-12-08 DIAGNOSIS — R030 Elevated blood-pressure reading, without diagnosis of hypertension: Secondary | ICD-10-CM | POA: Diagnosis not present

## 2019-12-08 DIAGNOSIS — I10 Essential (primary) hypertension: Secondary | ICD-10-CM | POA: Insufficient documentation

## 2019-12-08 DIAGNOSIS — J3089 Other allergic rhinitis: Secondary | ICD-10-CM | POA: Insufficient documentation

## 2019-12-08 MED ORDER — PANTOPRAZOLE SODIUM 40 MG PO TBEC
40.0000 mg | DELAYED_RELEASE_TABLET | Freq: Every day | ORAL | 1 refills | Status: DC
Start: 2019-12-08 — End: 2020-02-06

## 2019-12-08 MED ORDER — FLUTICASONE PROPIONATE 50 MCG/ACT NA SUSP: 1.0000 | Freq: Every day | NASAL | 2 refills | Status: DC

## 2019-12-08 MED ORDER — LEVOCETIRIZINE DIHYDROCHLORIDE 5 MG PO TABS: 5.0000 mg | ORAL_TABLET | Freq: Every evening | ORAL | 1 refills | Status: DC

## 2019-12-08 NOTE — Progress Notes (Signed)
Subjective:  Patient ID: Shane Pena, male    DOB: August 02, 1981  Age: 39 y.o. MRN: [id number]  CC:  Chief Complaint  Patient presents with  . Establish Care  . 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 Gastroesophageal Reflux    has been on omeprazole for about 7 years and states he gets heartburn everyday no matter what he eats       HPI  HPI   Shane Pena is a 39 year old African-American male patient who presents today to establish care here [facility].  He reports he has been going urgent care in [place] to get his care over the last several years.  Really wants to stop doing that.  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  He reports that he has tried Claritin or regular Zyrtec but they just do not help without the decongestant aspect of it.  He is willing to have some adjustment to changes in medication to see if that will help.  Additionally he reports he has some reflux has been on omeprazole for 7 years but he states he gets heartburn every day no matter what he eats he is try diet modification several times.  He denies having any other issues or concerns to discuss today. Denies having any alcohol or drug use.  Is current smoker about quarter pack a day.  Today patient denies signs and symptoms of COVID 19 infection including fever, chills, cough, shortness of breath, and headache. Past Medical, Surgical, Social History, Allergies, and Medications have been Reviewed.   Past Medical History:  Diagnosis Date  . Allergy   . GERD (gastroesophageal reflux disease)     Current Meds  Medication Sig  . omeprazole (PRILOSEC) 20 MG capsule Take 20 mg by mouth daily.    ROS:  Review of Systems  Constitutional: Negative.   HENT: Negative.    Eyes: Negative.   Respiratory: Negative.   Cardiovascular: Negative.   Gastrointestinal: Negative.   Genitourinary: Negative.   Musculoskeletal: Negative.   Skin: Negative.   Neurological: Negative.   Endo/Heme/Allergies: Negative.   Psychiatric/Behavioral: Negative.   All other systems reviewed and are negative.    Objective:   Today's Vitals: BP 122/86   Pulse 83   Temp 97.8 F (36.6 C) (Temporal)   Resp 15   Ht 6' (1.829 m)   Wt 260 lb (117.9 kg)   SpO2 97%   BMI 35.26 kg/m  Vitals with BMI 12/08/2019 08/21/2017 08/20/2017  Height 6\' 0"  6\' 0"  -  Weight 260 lbs 240 lbs -  BMI 84.16 60.63 -  Systolic 016 010 932  Diastolic 86 74 92  Pulse 83 90 82     Physical Exam Vitals and nursing note reviewed.  Constitutional:      Appearance: Normal appearance. He is well-developed and well-groomed. He is obese.  HENT:     Head: Normocephalic and atraumatic.     Right Ear: External ear normal.     Left Ear: External ear normal.     Mouth/Throat:     Comments: Mask in place Eyes:     General:        Right eye: No discharge.  Left eye: No discharge.     Conjunctiva/sclera: Conjunctivae normal.  Cardiovascular:     Rate and Rhythm: Normal rate and regular rhythm.     Pulses: Normal pulses.     Heart sounds: Normal heart sounds.  Pulmonary:     Effort: Pulmonary effort is normal.     Breath sounds: Normal breath sounds.  Musculoskeletal:        General: Normal range of motion.     Cervical back: Normal range of motion and neck supple.  Skin:    General: Skin is warm.  Neurological:     General: No focal deficit present.     Mental Status: He is alert and oriented to person, place, and time.  Psychiatric:        Attention and Perception: Attention normal.        Mood and Affect: Mood normal.        Speech: Speech normal.        Behavior: Behavior normal. Behavior is cooperative.        Thought Content: Thought content normal.        Cognition and Memory:  Cognition normal.        Judgment: Judgment normal.    Assessment   1. Environmental and seasonal allergies   2. Gastroesophageal reflux disease, unspecified whether esophagitis present   3. Obesity (BMI 30-39.9)   4. Prehypertension   5. Impaired fasting glucose   6. Family history of diabetes mellitus     Tests ordered Orders Placed This Encounter  Procedures  . Lipid panel  . Hemoglobin A1c  . COMPLETE METABOLIC PANEL WITH GFR  . CBC     Plan: Please see assessment and plan per problem list above.   Meds ordered this encounter  Medications  . levocetirizine (XYZAL) 5 MG tablet    Sig: Take 1 tablet (5 mg total) by mouth every evening.    Dispense:  30 tablet    Refill:  1    Order Specific Question:   Supervising Provider    Answer:   SIMPSON, MARGARET E [[id number]]  . fluticasone (FLONASE) 50 MCG/ACT nasal spray    Sig: Place 1 spray into both nostrils daily.    Dispense:  16 g    Refill:  2    Order Specific Question:   Supervising Provider    Answer:   SIMPSON, MARGARET E [[id number]]  . pantoprazole (PROTONIX) 40 MG tablet    Sig: Take 1 tablet (40 mg total) by mouth daily.    Dispense:  30 tablet    Refill:  1    Order Specific Question:   Supervising Provider    Answer:   Kerri Perches [[id number]]    Patient to follow-up in 6 months for annual  Freddy Finner, NP

## 2019-12-08 NOTE — Patient Instructions (Addendum)
I appreciate the opportunity to provide you with care for your health and wellness. Today we discussed: establish care   Follow up: 6 months for annual with vision   Labs today  Xyzal daily Flonase as needed Stop Zyrtec D for now  Stop Prilosec and start Protonix, take 1-2 hours prior to eating or taking other medication  Please continue to practice social distancing to keep you, your family, and our community safe.  If you must go out, please wear a mask and practice good handwashing.  It was a pleasure to see you and I look forward to continuing to work together on your health and well-being. Please do not hesitate to call the office if you need care or have questions about your care.  Have a wonderful day and week. With Gratitude, Tereasa Coop, DNP, AGNP-BC

## 2019-12-09 LAB — LIPID PANEL
Cholesterol: 193 mg/dL (ref <200)
HDL: 37 mg/dL — ABNORMAL LOW (ref >=40)
LDL Cholesterol (Calc): 130 mg/dL (calc) — ABNORMAL HIGH
Non-HDL Cholesterol (Calc): 156 mg/dL (calc) — ABNORMAL HIGH (ref <130)
Total CHOL/HDL Ratio: 5.2 (calc) — ABNORMAL HIGH (ref <5.0)
Triglycerides: 150 mg/dL — ABNORMAL HIGH (ref <150)

## 2019-12-09 LAB — COMPLETE METABOLIC PANEL WITH GFR
AG Ratio: 1.6 (calc) (ref 1.0–2.5)
ALT: 14 U/L (ref 9–46)
AST: 18 U/L (ref 10–40)
Albumin: 4.3 g/dL (ref 3.6–5.1)
Alkaline phosphatase (APISO): 71 U/L (ref 36–130)
BUN: 10 mg/dL (ref 7–25)
CO2: 26 mmol/L (ref 20–32)
Calcium: 9.9 mg/dL (ref 8.6–10.3)
Chloride: 106 mmol/L (ref 98–110)
Creat: 1.18 mg/dL (ref 0.60–1.35)
GFR, Est African American: 90 mL/min/{1.73_m2} (ref >=60)
GFR, Est Non African American: 78 mL/min/{1.73_m2} (ref >=60)
Globulin: 2.7 g/dL (calc) (ref 1.9–3.7)
Glucose, Bld: 102 mg/dL — ABNORMAL HIGH (ref 65–99)
Potassium: 4.6 mmol/L (ref 3.5–5.3)
Sodium: 139 mmol/L (ref 135–146)
Total Bilirubin: 0.2 mg/dL (ref 0.2–1.2)
Total Protein: 7 g/dL (ref 6.1–8.1)

## 2019-12-09 LAB — HEMOGLOBIN A1C
Hgb A1c MFr Bld: 5.5 % of total Hgb (ref <5.7)
Mean Plasma Glucose: 111 (calc)
eAG (mmol/L): 6.2 (calc)

## 2019-12-09 LAB — CBC
HCT: 41.7 % (ref 38.5–50.0)
Hemoglobin: 13.8 g/dL (ref 13.2–17.1)
MCH: 28.6 pg (ref 27.0–33.0)
MCHC: 33.1 g/dL (ref 32.0–36.0)
MCV: 86.3 fL (ref 80.0–100.0)
MPV: 9.5 fL (ref 7.5–12.5)
Platelets: 399 10*3/uL (ref 140–400)
RBC: 4.83 10*6/uL (ref 4.20–5.80)
RDW: 13.4 % (ref 11.0–15.0)
WBC: 8.4 10*3/uL (ref 3.8–10.8)

## 2020-01-01 DIAGNOSIS — Z20822 Contact with and (suspected) exposure to covid-19: Secondary | ICD-10-CM | POA: Diagnosis not present

## 2020-02-06 ENCOUNTER — Other Ambulatory Visit: Payer: Self-pay | Admitting: *Deleted

## 2020-02-06 DIAGNOSIS — K219 Gastro-esophageal reflux disease without esophagitis: Secondary | ICD-10-CM

## 2020-02-06 DIAGNOSIS — J3089 Other allergic rhinitis: Secondary | ICD-10-CM

## 2020-02-06 MED ORDER — LEVOCETIRIZINE DIHYDROCHLORIDE 5 MG PO TABS: 5.0000 mg | ORAL_TABLET | Freq: Every evening | ORAL | 1 refills | Status: DC

## 2020-02-06 MED ORDER — PANTOPRAZOLE SODIUM 40 MG PO TBEC: 40.0000 mg | DELAYED_RELEASE_TABLET | Freq: Every day | ORAL | 1 refills | Status: DC

## 2020-03-12 ENCOUNTER — Other Ambulatory Visit: Payer: Self-pay

## 2020-03-12 DIAGNOSIS — J3089 Other allergic rhinitis: Secondary | ICD-10-CM

## 2020-03-12 DIAGNOSIS — K219 Gastro-esophageal reflux disease without esophagitis: Secondary | ICD-10-CM

## 2020-03-12 MED ORDER — LEVOCETIRIZINE DIHYDROCHLORIDE 5 MG PO TABS: 5.0000 mg | ORAL_TABLET | Freq: Every evening | ORAL | 1 refills | Status: DC

## 2020-03-12 MED ORDER — PANTOPRAZOLE SODIUM 40 MG PO TBEC
40.0000 mg | DELAYED_RELEASE_TABLET | Freq: Every day | ORAL | 1 refills | Status: DC
Start: 2020-03-12 — End: 2020-03-13

## 2020-03-13 ENCOUNTER — Other Ambulatory Visit: Payer: Self-pay

## 2020-03-13 ENCOUNTER — Encounter: Payer: Self-pay | Admitting: Family Medicine

## 2020-03-13 DIAGNOSIS — J3089 Other allergic rhinitis: Secondary | ICD-10-CM

## 2020-03-13 DIAGNOSIS — K219 Gastro-esophageal reflux disease without esophagitis: Secondary | ICD-10-CM

## 2020-03-13 MED ORDER — LEVOCETIRIZINE DIHYDROCHLORIDE 5 MG PO TABS: 5.0000 mg | ORAL_TABLET | Freq: Every evening | ORAL | 1 refills | Status: DC

## 2020-03-13 MED ORDER — PANTOPRAZOLE SODIUM 40 MG PO TBEC: 40.0000 mg | DELAYED_RELEASE_TABLET | Freq: Every day | ORAL | 1 refills | Status: DC

## 2020-03-13 NOTE — Telephone Encounter (Signed)
Called pt to make him appt he stated he couldn't do an appointment today because he was at work scheduled him for 03-27-20 with Dahlia Client

## 2020-03-15 ENCOUNTER — Other Ambulatory Visit: Payer: Self-pay

## 2020-03-15 DIAGNOSIS — J3089 Other allergic rhinitis: Secondary | ICD-10-CM

## 2020-03-15 MED ORDER — FLUTICASONE PROPIONATE 50 MCG/ACT NA SUSP: 1.0000 | Freq: Every day | NASAL | 2 refills | Status: DC

## 2020-03-27 ENCOUNTER — Other Ambulatory Visit: Payer: Self-pay | Admitting: *Deleted

## 2020-03-27 ENCOUNTER — Encounter: Payer: Self-pay | Admitting: Family Medicine

## 2020-03-27 ENCOUNTER — Other Ambulatory Visit: Payer: Self-pay

## 2020-03-27 ENCOUNTER — Ambulatory Visit (INDEPENDENT_AMBULATORY_CARE_PROVIDER_SITE_OTHER): Payer: BC Managed Care – PPO | Admitting: Family Medicine

## 2020-03-27 VITALS — BP 143/90 | HR 94 | Temp 97.3°F | Resp 18 | Ht 72.0 in | Wt 258.4 lb

## 2020-03-27 DIAGNOSIS — I10 Essential (primary) hypertension: Secondary | ICD-10-CM | POA: Diagnosis not present

## 2020-03-27 DIAGNOSIS — R21 Rash and other nonspecific skin eruption: Secondary | ICD-10-CM | POA: Diagnosis not present

## 2020-03-27 DIAGNOSIS — J3089 Other allergic rhinitis: Secondary | ICD-10-CM

## 2020-03-27 MED ORDER — LEVOCETIRIZINE DIHYDROCHLORIDE 5 MG PO TABS: 5.0000 mg | ORAL_TABLET | Freq: Every evening | ORAL | 1 refills | Status: DC

## 2020-03-27 MED ORDER — DOXYCYCLINE HYCLATE 100 MG PO TABS: 100.0000 mg | ORAL_TABLET | Freq: Two times a day (BID) | ORAL | 0 refills | Status: AC

## 2020-03-27 MED ORDER — HIBICLENS 4 % EX LIQD: Freq: Every day | CUTANEOUS | 0 refills | Status: DC | PRN

## 2020-03-27 NOTE — Patient Instructions (Addendum)
I appreciate the opportunity to provide you with care for your health and wellness. Today we discussed: rash   Follow up: as scheduled   No labs or referrals today  Please take medication as directed. Use wash until skin is improved. Call if no improvement, might need to see a dermatologist.  Tea tree oil is helpful to keep skin clear of bacteria that might be causing this. See if you can find a body wash to use 2-3 times a week to help.   Please continue to practice social distancing to keep you, your family, and our community safe.  If you must go out, please wear a mask and practice good handwashing.  It was a pleasure to see you and I look forward to continuing to work together on your health and well-being. Please do not hesitate to call the office if you need care or have questions about your care.  Have a wonderful day and week. With Gratitude, Tereasa Coop, DNP, AGNP-BC

## 2020-03-27 NOTE — Assessment & Plan Note (Addendum)
Shane Pena is encouraged to maintain a well balanced diet that is low in salt. Not controlled-however was previously controlled.  Will assess at next visit and if still elevated will start treatment. Additionally, he is also reminded that exercise is beneficial for heart health and control of  Blood pressure. 30-60 minutes daily is recommended-walking was suggested.

## 2020-03-27 NOTE — Assessment & Plan Note (Signed)
Provided with Hibiclens and a course of doxycycline.  If this does not have cleared up referral to dermatology will be made.  Additionally encouraged him to use tea tree oil body wash to help fight bacterial and fungal infections

## 2020-03-27 NOTE — Progress Notes (Signed)
dox       Subjective:  Patient ID: Shane Pena, male    DOB: 12-13-1980  Age: 39 y.o. MRN: [id number]  CC:  Chief Complaint  Patient presents with  . Rash    rash came up under arms he has had this before and was given a wash and this helped but now its back only under his arms       HPI  HPI  Shane Pena is a 39 year old male patient of mine.  He presents today with a rash under his arms.  He reports he has had this rash before.  He thinks it might be associated with the heat in the summertime as he does not notice it during the winter.  Rash came up under his arms and he was provided with a wash before he believes he did really well with that but they usually will come back on.  He reports that some of it is like a boil and uncomfortable and a little bit pustule.  And he has a few coming up on his leg.  He denies ever having any history of MRSA or staph infections.  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  He denies having any itchiness or any other exposure or causes of the infection.  He has not been bitten by any insects that he is aware of.  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  He has been doing this for a while without any issue.  Today patient denies signs and symptoms of COVID 19 infection including fever, chills, cough, shortness of breath, and headache. Past Medical, Surgical, Social History, Allergies, and Medications have been Reviewed.   Past Medical History:  Diagnosis Date  . Allergy   . GERD (gastroesophageal reflux disease)     Current Meds  Medication Sig  . fluticasone (FLONASE) 50 MCG/ACT nasal spray Place 1 spray into both nostrils daily.  Marland Kitchen levocetirizine (XYZAL) 5 MG tablet Take 1 tablet (5 mg total) by mouth every evening.  Marland Kitchen omeprazole (PRILOSEC) 20 MG capsule Take 20 mg by mouth daily.  . pantoprazole (PROTONIX) 40 MG tablet Take 1 tablet (40 mg total) by mouth daily.    ROS:  Review of Systems    Constitutional: Negative.   HENT: Negative.   Eyes: Negative.   Respiratory: Negative.   Cardiovascular: Negative.   Gastrointestinal: Negative.   Genitourinary: Negative.   Musculoskeletal: Negative.   Skin: Positive for rash.  Neurological: Negative.   Endo/Heme/Allergies: Negative.   Psychiatric/Behavioral: Negative.   All other systems reviewed and are negative.    Objective:   Today's Vitals: BP (!) 143/90 (BP Location: Right Arm, Patient Position: Sitting, Cuff Size: Normal)   Pulse 94   Temp (!) 97.3 F (36.3 C) (Temporal)   Resp 18   Ht 6' (1.829 m)   Wt 258 lb 6.4 oz (117.2 kg)   SpO2 98%   BMI 35.05 kg/m  Vitals with BMI 03/27/2020 12/08/2019 08/21/2017  Height 6\' 0"  6\' 0"  6\' 0"   Weight 258 lbs 6 oz 260 lbs 240 lbs  BMI 35.04 35.25 32.54  Systolic 143 122  Diastolic 90 86 74  Pulse 94 83 90     Physical Exam Vitals and nursing note reviewed.  Constitutional:      Appearance: Normal appearance. He is well-developed and well-groomed. He is obese.  HENT:     Head: Normocephalic and atraumatic.  Right Ear: External ear normal.     Left Ear: External ear normal.     Mouth/Throat:     Comments: Mask in place  Eyes:     General:        Right eye: No discharge.        Left eye: No discharge.     Conjunctiva/sclera: Conjunctivae normal.  Cardiovascular:     Rate and Rhythm: Normal rate and regular rhythm.     Pulses: Normal pulses.     Heart sounds: Normal heart sounds.  Pulmonary:     Effort: Pulmonary effort is normal.     Breath sounds: Normal breath sounds.  Musculoskeletal:        General: Normal range of motion.     Cervical back: Normal range of motion and neck supple.  Skin:    General: Skin is warm.     Findings: Rash present. Rash is nodular and pustular.     Comments: Rash under arms   Neurological:     General: No focal deficit present.     Mental Status: He is alert and oriented to person, place, and time.  Psychiatric:         Attention and Perception: Attention normal.        Mood and Affect: Mood normal.        Speech: Speech normal.        Behavior: Behavior normal. Behavior is cooperative.        Thought Content: Thought content normal.        Cognition and Memory: Cognition normal.        Judgment: Judgment normal.      Assessment   1. Rash in adult   2. Essential hypertension     Tests ordered No orders of the defined types were placed in this encounter.    Plan: Please see assessment and plan per problem list above.   No orders of the defined types were placed in this encounter.   Patient to follow-up in 06/14/2020  Freddy Finner, NP

## 2020-06-14 ENCOUNTER — Other Ambulatory Visit: Payer: Self-pay

## 2020-06-14 ENCOUNTER — Encounter: Payer: Self-pay | Admitting: Family Medicine

## 2020-06-14 ENCOUNTER — Ambulatory Visit (INDEPENDENT_AMBULATORY_CARE_PROVIDER_SITE_OTHER): Payer: BC Managed Care – PPO | Admitting: Family Medicine

## 2020-06-14 ENCOUNTER — Other Ambulatory Visit: Payer: Self-pay | Admitting: *Deleted

## 2020-06-14 VITALS — BP 132/89 | HR 75 | Temp 97.0°F | Resp 18 | Ht 72.0 in | Wt 259.0 lb

## 2020-06-14 DIAGNOSIS — I10 Essential (primary) hypertension: Secondary | ICD-10-CM | POA: Diagnosis not present

## 2020-06-14 DIAGNOSIS — Z0001 Encounter for general adult medical examination with abnormal findings: Secondary | ICD-10-CM | POA: Diagnosis not present

## 2020-06-14 DIAGNOSIS — E669 Obesity, unspecified: Secondary | ICD-10-CM

## 2020-06-14 DIAGNOSIS — K219 Gastro-esophageal reflux disease without esophagitis: Secondary | ICD-10-CM | POA: Diagnosis not present

## 2020-06-14 DIAGNOSIS — R21 Rash and other nonspecific skin eruption: Secondary | ICD-10-CM

## 2020-06-14 MED ORDER — KETOCONAZOLE 2 % EX CREA: 1.0000 "application " | TOPICAL_CREAM | Freq: Every day | CUTANEOUS | 0 refills | Status: DC

## 2020-06-14 MED ORDER — PANTOPRAZOLE SODIUM 40 MG PO TBEC: 40.0000 mg | DELAYED_RELEASE_TABLET | Freq: Every day | ORAL | 1 refills | Status: DC

## 2020-06-14 NOTE — Assessment & Plan Note (Signed)
Discussed PSA screening (risks/benefits), recommended at least 30 minutes of aerobic activity at least 5 days/week; proper sunscreen use reviewed; healthy diet and alcohol recommendations (less than or equal to 2 drinks/day) reviewed; regular seatbelt use; changing batteries in smoke detectors. Immunization recommendations discussed.  Colonoscopy recommendations reviewed. ° °

## 2020-06-14 NOTE — Assessment & Plan Note (Signed)
Rash appears to be more like a fungal rash as it continues to occur especially in heated areas such as under the arms and in the groin.  We will try a fungal cream if not improved referral to dermatology will be made. Reviewed side effects, risks and benefits of medication.   Patient acknowledged agreement and understanding of the plan.

## 2020-06-14 NOTE — Assessment & Plan Note (Signed)
Shane Pena is encouraged to maintain a well balanced diet that is low in salt. Higher end of normal, however, he reports working on this and would like to wait before starting something. Additionally, he is also reminded that exercise is beneficial for heart health and control of  Blood pressure. 30-60 minutes daily is recommended-walking was suggested.

## 2020-06-14 NOTE — Assessment & Plan Note (Signed)
Unchanged  Shane Pena is educated about the importance of exercise daily to help with weight management. A minumum of 30 minutes daily is recommended. Additionally, importance of healthy food choices  with portion control discussed.   Wt Readings from Last 3 Encounters:  06/14/20 259 lb (117.5 kg)  03/27/20 258 lb 6.4 oz (117.2 kg)  12/08/19 260 lb (117.9 kg)

## 2020-06-14 NOTE — Patient Instructions (Addendum)
I appreciate the opportunity to provide you with care for your health and wellness. Today we discussed: overall health  Follow up: 1 year for CPE -fasting that morning for labs   Labs - [facility] fasting next week No referrals today- but if fungal cream is not helpful we will do a referral to Dermatology.  Please continue to practice social distancing to keep you, your family, and our community safe.  If you must go out, please wear a mask and practice good handwashing.  It was a pleasure to see you and I look forward to continuing to work together on your health and well-being. Please do not hesitate to call the office if you need care or have questions about your care.  Have a wonderful day and week. With Gratitude, Tereasa Coop, DNP, AGNP-BC  HEALTH MAINTENANCE RECOMMENDATIONS:  It is recommended that you get at least 30 minutes of aerobic exercise at least 5 days/week (for weight loss, you may need as much as 60-90 minutes). This can be any activity that gets your heart rate up. This can be divided in 10-15 minute intervals if needed, but try and build up your endurance at least once a week.  Weight bearing exercise is also recommended twice weekly.  Eat a healthy diet with lots of vegetables, fruits and fiber.  "Colorful" foods have a lot of vitamins (ie green vegetables, tomatoes, red peppers, etc).  Limit sweet tea, regular sodas and alcoholic beverages, all of which has a lot of calories and sugar.  Up to 2 alcoholic drinks daily may be beneficial for men (unless trying to lose weight, watch sugars).  Drink a lot of water.  Sunscreen of at least SPF 30 should be used on all sun-exposed parts of the skin when outside between the hours of 10 am and 4 pm (not just when at beach or pool, but even with exercise, golf, tennis, and yard work!)  Use a sunscreen that says "broad spectrum" so it covers both UVA and UVB rays, and make sure to reapply every 1-2 hours.  Remember to change the  batteries in your smoke detectors when changing your clock times in the spring and fall.  Use your seat belt every time you are in a car, and please drive safely and not be distracted with cell phones and texting while driving.

## 2020-06-14 NOTE — Assessment & Plan Note (Signed)
Controlled, continue medication as needed

## 2020-06-14 NOTE — Progress Notes (Signed)
Health Maintenance reviewed -   There is no immunization history on file for this patient. Last colonoscopy: n/a Last PSA: n/a Dentist: Twice yearly Ophtho: Not needed though vision today in the office demonstrated possible need Exercise: Goes to the gym regularly very active at work as well. Smoker: Currently smoking a quarter to half a pack a day. Alcohol Use: Limited to no alcohol  Other doctors caring for patient include:  Patient Care Team: Freddy Finner, NP as PCP - General (Family Medicine)  End of Life Discussion:  Patient does not have a living will and medical power of attorney  Subjective:   HPI  Shane Pena is a 39 y.o. male who presents for annual wellness visit and follow-up on chronic medical conditions.  He has the following concerns: Rash is still giving him trouble.  He believes it is sweat related.  He is willing to try a fungal cream.  Referral to dermatology if that does not help.  He denies having any trouble sleeping, chewing or swallowing or appetite changes, trouble going to the restroom making water or bowel movements.  No blood in urine or stool.  Denies having any recent changes in memory, falls or injuries, hearing changes or vision changes.  Recommendation was for him to follow-up with the eye doctor secondary to right eye changes on the vision test today.  He denies having any chest pain, headaches, dizziness, leg swelling, cough, shortness of breath, palpitations.  Review Of Systems  Review of Systems  Constitutional: Negative.   HENT: Negative.   Eyes: Negative.   Cardiovascular: Negative.   Gastrointestinal: Negative.   Endocrine: Negative.   Genitourinary: Negative.   Musculoskeletal: Negative.   Skin: Negative.   Allergic/Immunologic: Negative.   Neurological: Negative.   Hematological: Negative.   Psychiatric/Behavioral: Negative.     Objective:   PHYSICAL EXAM:  BP 132/89 (BP Location: Right Arm, Patient Position:  Sitting, Cuff Size: Normal)   Pulse 75   Temp (!) 97 F (36.1 C) (Temporal)   Resp 18   Ht 6' (1.829 m)   Wt 259 lb (117.5 kg)   SpO2 96%   BMI 35.13 kg/m  Physical Exam Vitals and nursing note reviewed.  Constitutional:      Appearance: Normal appearance. He is well-developed and well-groomed. He is obese.  HENT:     Head: Normocephalic and atraumatic.     Right Ear: Hearing, tympanic membrane, ear canal and external ear normal.     Left Ear: Hearing, tympanic membrane, ear canal and external ear normal.     Nose: Nose normal.     Mouth/Throat:     Lips: Pink.     Mouth: Mucous membranes are moist.     Pharynx: Oropharynx is clear. Uvula midline.  Eyes:     General: Lids are normal.        Right eye: No discharge.        Left eye: No discharge.     Extraocular Movements: Extraocular movements intact.     Conjunctiva/sclera: Conjunctivae normal.     Pupils: Pupils are equal, round, and reactive to light.  Neck:     Thyroid: No thyroid mass, thyromegaly or thyroid tenderness.     Vascular: No carotid bruit.  Cardiovascular:     Rate and Rhythm: Normal rate and regular rhythm.     Pulses: Normal pulses.     Heart sounds: Normal heart sounds.  Pulmonary:     Effort: Pulmonary effort is normal.  Breath sounds: Normal breath sounds.  Abdominal:     General: Bowel sounds are normal. There is no distension.     Palpations: Abdomen is soft.     Tenderness: There is no abdominal tenderness. There is no right CVA tenderness or left CVA tenderness.     Hernia: No hernia is present.  Musculoskeletal:        General: Normal range of motion.     Cervical back: Normal range of motion and neck supple.     Right lower leg: No edema.     Left lower leg: No edema.     Comments: MAE, ROM intact   Lymphadenopathy:     Cervical: No cervical adenopathy.  Skin:    General: Skin is warm and dry.     Capillary Refill: Capillary refill takes less than 2 seconds.     Findings: Rash  present.  Neurological:     General: No focal deficit present.     Mental Status: He is alert and oriented to person, place, and time.     Cranial Nerves: Cranial nerves are intact.     Sensory: Sensation is intact.     Motor: Motor function is intact.     Coordination: Coordination is intact.     Gait: Gait is intact.     Deep Tendon Reflexes: Reflexes are normal and symmetric.  Psychiatric:        Attention and Perception: Attention and perception normal.        Mood and Affect: Mood and affect normal.        Speech: Speech normal.        Behavior: Behavior normal. Behavior is cooperative.        Thought Content: Thought content normal.        Cognition and Memory: Cognition and memory normal.        Judgment: Judgment normal.    Depression Screening  Depression screen [facility] 2/9 06/14/2020 03/27/2020 12/08/2019  Decreased Interest 0 0 0  Down, Depressed, Hopeless 0 0 0  PHQ - 2 Score 0 0 0     Falls  Fall Risk  06/14/2020 03/27/2020  Falls in the past year? 0 0  Number falls in past yr: 0 0  Injury with Fall? 0 0  Risk for fall due to : No Fall Risks No Fall Risks  Follow up Falls evaluation completed Falls evaluation completed    Assessment & Plan:   1. Annual visit for general adult medical examination with abnormal findings   2. Essential hypertension   3. Gastroesophageal reflux disease, unspecified whether esophagitis present   4. Obesity (BMI 30-39.9)   5. Rash in adult     Tests ordered Orders Placed This Encounter  Procedures  . CBC  . Comprehensive metabolic panel  . Hemoglobin A1c  . Lipid panel     Plan: Please see assessment and plan per problem list above.   Meds ordered this encounter  Medications  . ketoconazole (NIZORAL) 2 % cream    Sig: Apply 1 application topically daily.    Dispense:  15 g    Refill:  0    Order Specific Question:   Supervising Provider    Answer:   Syliva Overman E [[id number]]    I have personally reviewed: The  patient's medical and social history Their use of alcohol, tobacco or illicit drugs Their current medications and supplements The patient's functional ability including ADLs,fall risks, home safety risks, cognitive, and hearing and visual  impairment Diet and physical activities Evidence for depression or mood disorders  The patient's weight, height, BMI, and visual acuity have been recorded in the chart.  I have made referrals, counseling, and provided education to the patient based on review of the above and I have provided the patient with a written personalized care plan for preventive services.    Note: This dictation was prepared with Dragon dictation along with smaller phrase technology. Similar sounding words can be transcribed inadequately or may not be corrected upon review. Any transcriptional errors that result from this process are unintentional.      Freddy Finner, NP   06/14/2020

## 2020-07-19 ENCOUNTER — Other Ambulatory Visit: Payer: Self-pay

## 2020-07-19 ENCOUNTER — Other Ambulatory Visit: Payer: BC Managed Care – PPO

## 2020-07-19 DIAGNOSIS — Z20822 Contact with and (suspected) exposure to covid-19: Secondary | ICD-10-CM

## 2020-07-20 ENCOUNTER — Encounter: Payer: Self-pay | Admitting: Family Medicine

## 2020-07-20 ENCOUNTER — Telehealth: Payer: Self-pay

## 2020-07-20 LAB — NOVEL CORONAVIRUS, NAA: SARS-CoV-2, NAA: NOT DETECTED

## 2020-07-20 LAB — SARS-COV-2, NAA 2 DAY TAT

## 2020-07-20 NOTE — Telephone Encounter (Signed)
Pt called for covid test results- advised that results are not back. Advised to be aware of any MyChart notifications when results are available.

## 2020-08-06 ENCOUNTER — Ambulatory Visit (INDEPENDENT_AMBULATORY_CARE_PROVIDER_SITE_OTHER): Payer: BC Managed Care – PPO | Admitting: Internal Medicine

## 2020-08-06 ENCOUNTER — Other Ambulatory Visit: Payer: Self-pay

## 2020-08-06 ENCOUNTER — Encounter: Payer: Self-pay | Admitting: Family Medicine

## 2020-08-06 ENCOUNTER — Encounter: Payer: Self-pay | Admitting: Internal Medicine

## 2020-08-06 VITALS — BP 137/97 | HR 92 | Temp 97.7°F | Resp 18 | Ht 72.0 in | Wt 257.4 lb

## 2020-08-06 DIAGNOSIS — M62838 Other muscle spasm: Secondary | ICD-10-CM | POA: Diagnosis not present

## 2020-08-06 DIAGNOSIS — T148XXA Other injury of unspecified body region, initial encounter: Secondary | ICD-10-CM | POA: Diagnosis not present

## 2020-08-06 MED ORDER — CYCLOBENZAPRINE HCL 10 MG PO TABS: 10.0000 mg | ORAL_TABLET | Freq: Every evening | ORAL | 0 refills | Status: DC | PRN

## 2020-08-06 NOTE — Progress Notes (Signed)
Established Patient Office Visit  Subjective:  Patient ID: Shane Pena, male    DOB: 10-07-1980  Age: 39 y.o. MRN: [id number]  CC:  Chief Complaint  Patient presents with  . Acute Visit    neck hurtin since saturday hurts right down the back of neck is worse today than has been     HPI Shane Pena presents for evaluation of neck pain.  Neck Pain: Paitent complains of neck pain. Event that precipitate these symptoms: none known. Onset of symptoms 3 days ago, gradually worsening since that time. Patient denies numbness in UE. Patient has had no prior neck problems.  Previous treatments include: medication: NSAID: Ibuprofen. He does weight training and exercises, which he has not been doing for last 2 weeks.   Past Medical History:  Diagnosis Date  . Allergy   . GERD (gastroesophageal reflux disease)     Past Surgical History:  Procedure Laterality Date  . ABDOMINAL SURGERY      History reviewed. No pertinent family history.  Social History   Socioeconomic History  . Marital status: Significant Other    Spouse name: Not on file  . Number of children: Not on file  . Years of education: Not on file  . Highest education level: Not on file  Occupational History  . Not on file  Tobacco Use  . Smoking status: Current Every Day Smoker    Packs/day: 0.25    Types: Cigarettes  . Smokeless tobacco: Never Used  Vaping Use  . Vaping Use: Never used  Substance and Sexual Activity  . Alcohol use: No  . Drug use: No  . Sexual activity: Yes    Birth control/protection: None  Other Topics Concern  . Not on file  Social History Narrative   Lives with [name]    No children   Dog: Bella-pit bull      Enjoys: fishing, sports-cowboys fan       Diet: [place] and chicken, veggies,    Caffeine: no soda, limited maybe once a week   Water: 8 cups daily       Wears seat belt    Smoke detectors in homes   Does not use phone while driving   Social Determinants of  Health   Financial Resource Strain: Low Risk   . Difficulty of Paying Living Expenses: Not hard at all  Food Insecurity: No Food Insecurity  . Worried About Programme researcher, broadcasting/film/video in the Last Year: Never true  . Ran Out of Food in the Last Year: Never true  Transportation Needs: No Transportation Needs  . Lack of Transportation (Medical): No  . Lack of Transportation (Non-Medical): No  Physical Activity: Sufficiently Active  . Days of Exercise per Week: 4 days  . Minutes of Exercise per Session: 60 min  Stress: No Stress Concern Present  . Feeling of Stress : Not at all  Social Connections: Moderately Integrated  . Frequency of Communication with Friends and Family: More than three times a week  . Frequency of Social Gatherings with Friends and Family: More than three times a week  . Attends Religious Services: More than 4 times per year  . Active Member of Clubs or Organizations: No  . Attends Banker Meetings: Never  . Marital Status: Living with partner  Intimate Partner Violence: Not At Risk  . Fear of Current or Ex-Partner: No  . Emotionally Abused: No  . Physically Abused: No  . Sexually Abused: No  Outpatient Medications Prior to Visit  Medication Sig Dispense Refill  . chlorhexidine (HIBICLENS) 4 % external liquid Apply topically daily as needed. 120 mL 0  . fluticasone (FLONASE) 50 MCG/ACT nasal spray Place 1 spray into both nostrils daily. 16 g 2  . ketoconazole (NIZORAL) 2 % cream Apply 1 application topically daily. 15 g 0  . levocetirizine (XYZAL) 5 MG tablet Take 1 tablet (5 mg total) by mouth every evening. 90 tablet 1  . omeprazole (PRILOSEC) 20 MG capsule Take 20 mg by mouth daily.    . pantoprazole (PROTONIX) 40 MG tablet Take 1 tablet (40 mg total) by mouth daily. 90 tablet 1   No facility-administered medications prior to visit.    Allergies  Allergen Reactions  . Other Itching and Rash  . Pineapple     Itching     ROS Review of Systems   Constitutional: Negative for chills and fever.  HENT: Negative for congestion and sore throat.   Eyes: Negative for pain and discharge.  Respiratory: Negative for cough and shortness of breath.   Cardiovascular: Negative for chest pain and palpitations.  Gastrointestinal: Negative for constipation, diarrhea, nausea and vomiting.  Endocrine: Negative for polydipsia and polyuria.  Genitourinary: Negative for dysuria and hematuria.  Musculoskeletal: Positive for neck pain. Negative for neck stiffness.  Skin: Negative for rash.  Neurological: Negative for dizziness, weakness, numbness and headaches.  Psychiatric/Behavioral: Negative for agitation and behavioral problems.      Objective:    Physical Exam Vitals reviewed.  Constitutional:      General: He is not in acute distress.    Appearance: He is not diaphoretic.  HENT:     Head: Normocephalic and atraumatic.     Nose: Nose normal.     Mouth/Throat:     Mouth: Mucous membranes are moist.  Eyes:     General: No scleral icterus.    Extraocular Movements: Extraocular movements intact.     Pupils: Pupils are equal, round, and reactive to light.  Neck:     Comments: ROM painful Cardiovascular:     Rate and Rhythm: Normal rate and regular rhythm.     Heart sounds: No murmur heard.   Pulmonary:     Breath sounds: Normal breath sounds. No wheezing or rales.  Abdominal:     Palpations: Abdomen is soft.     Tenderness: There is no abdominal tenderness.  Musculoskeletal:        General: No signs of injury.     Cervical back: Neck supple. No tenderness.     Right lower leg: No edema.     Left lower leg: No edema.  Skin:    General: Skin is warm.     Findings: No rash.  Neurological:     General: No focal deficit present.     Mental Status: He is alert and oriented to person, place, and time.     Sensory: No sensory deficit.     Motor: No weakness.  Psychiatric:        Mood and Affect: Mood normal.        Behavior:  Behavior normal.      BP (!) 137/97 (BP Location: Right Arm, Patient Position: Sitting, Cuff Size: Normal)   Pulse 92   Temp 97.7 F (36.5 C) (Oral)   Resp 18   Ht 6' (1.829 m)   Wt 257 lb 6.4 oz (116.8 kg)   SpO2 98%   BMI 34.91 kg/m  Wt Readings from Last 3  Encounters:  08/06/20 257 lb 6.4 oz (116.8 kg)  06/14/20 259 lb (117.5 kg)  03/27/20 258 lb 6.4 oz (117.2 kg)     Health Maintenance Due  Topic Date Due  . Hepatitis C Screening  Never done  . COVID-19 Vaccine (1) Never done  . HIV Screening  Never done  . TETANUS/TDAP  Never done  . INFLUENZA VACCINE  Never done    There are no preventive care reminders to display for this patient.  No results found for: TSH Lab Results  Component Value Date   WBC 8.4 12/08/2019   HGB 13.8 12/08/2019   HCT 41.7 12/08/2019   MCV 86.3 12/08/2019   PLT 399 12/08/2019   Lab Results  Component Value Date   NA 139 12/08/2019   K 4.6 12/08/2019   CO2 26 12/08/2019   GLUCOSE 102 (H) 12/08/2019   BUN 10 12/08/2019   CREATININE 1.18 12/08/2019   BILITOT 0.2 12/08/2019   AST 18 12/08/2019   ALT 14 12/08/2019   PROT 7.0 12/08/2019   CALCIUM 9.9 12/08/2019   Lab Results  Component Value Date   CHOL 193 12/08/2019   Lab Results  Component Value Date   HDL 37 (L) 12/08/2019   Lab Results  Component Value Date   LDLCALC 130 (H) 12/08/2019   Lab Results  Component Value Date   TRIG 150 (H) 12/08/2019   Lab Results  Component Value Date   CHOLHDL 5.2 (H) 12/08/2019   Lab Results  Component Value Date   HGBA1C 5.5 12/08/2019      Assessment & Plan:   Problem List Items Addressed This Visit        Visit Diagnoses    Muscle strain    -  Primary   Muscle spasm Flexeril PRN for muscle spasm Advised to perform simple neck exercises Ibuprofen/Tylenol PRN Avoid weight training for now Simple massage with pain relieving cream discussed   Relevant Medications   cyclobenzaprine (FLEXERIL) 10 MG tablet       Meds ordered this encounter  Medications  . cyclobenzaprine (FLEXERIL) 10 MG tablet    Sig: Take 1 tablet (10 mg total) by mouth at bedtime as needed for muscle spasms.    Dispense:  30 tablet    Refill:  0    Follow-up: Return if symptoms worsen or fail to improve.    Anabel Halon, MD

## 2020-08-06 NOTE — Patient Instructions (Signed)
Please take Flexeril as needed for muscle spasms. Please try to take it at nighttime as it can cause drowsiness.  Please continue to take Ibuprofen/Tylenol up to three times in a day.  Please perform simple neck exercises as discussed.  Okay to apply local pain creams to help with the pain. Perform simple massage of the shoulder/back as instructed.

## 2020-08-14 ENCOUNTER — Encounter: Payer: Self-pay | Admitting: Family Medicine

## 2020-08-14 ENCOUNTER — Other Ambulatory Visit: Payer: Self-pay

## 2020-08-14 DIAGNOSIS — K219 Gastro-esophageal reflux disease without esophagitis: Secondary | ICD-10-CM

## 2020-08-14 MED ORDER — PANTOPRAZOLE SODIUM 40 MG PO TBEC: 40.0000 mg | DELAYED_RELEASE_TABLET | Freq: Every day | ORAL | 1 refills | Status: DC

## 2020-09-17 ENCOUNTER — Other Ambulatory Visit: Payer: BC Managed Care – PPO

## 2020-09-19 ENCOUNTER — Other Ambulatory Visit: Payer: BC Managed Care – PPO

## 2020-09-19 DIAGNOSIS — Z20822 Contact with and (suspected) exposure to covid-19: Secondary | ICD-10-CM | POA: Diagnosis not present

## 2020-09-21 LAB — SARS-COV-2, NAA 2 DAY TAT

## 2020-09-21 LAB — NOVEL CORONAVIRUS, NAA: SARS-CoV-2, NAA: NOT DETECTED

## 2020-10-07 ENCOUNTER — Other Ambulatory Visit: Payer: BC Managed Care – PPO

## 2020-10-11 ENCOUNTER — Encounter: Payer: Self-pay | Admitting: Family Medicine

## 2020-10-13 ENCOUNTER — Other Ambulatory Visit: Payer: Self-pay | Admitting: *Deleted

## 2020-10-13 MED ORDER — OMEPRAZOLE 20 MG PO CPDR: 20.0000 mg | DELAYED_RELEASE_CAPSULE | Freq: Every day | ORAL | 0 refills | Status: DC

## 2020-10-21 ENCOUNTER — Encounter: Payer: Self-pay | Admitting: Family Medicine

## 2020-10-22 ENCOUNTER — Ambulatory Visit: Payer: BC Managed Care – PPO | Admitting: Nurse Practitioner

## 2020-10-22 ENCOUNTER — Encounter: Payer: Self-pay | Admitting: Nurse Practitioner

## 2020-10-22 ENCOUNTER — Other Ambulatory Visit: Payer: Self-pay

## 2020-10-22 DIAGNOSIS — G43019 Migraine without aura, intractable, without status migrainosus: Secondary | ICD-10-CM

## 2020-10-22 DIAGNOSIS — G43909 Migraine, unspecified, not intractable, without status migrainosus: Secondary | ICD-10-CM | POA: Insufficient documentation

## 2020-10-22 MED ORDER — BUTALBITAL-APAP-CAFFEINE 50-325-40 MG PO TABS: 1.0000 | ORAL_TABLET | Freq: Four times a day (QID) | ORAL | 0 refills | Status: AC | PRN

## 2020-10-22 MED ORDER — KETOROLAC TROMETHAMINE 60 MG/2ML IM SOLN
60.0000 mg | Freq: Once | INTRAMUSCULAR | Status: AC
  Administered 2020-10-22: 30 mg via INTRAMUSCULAR

## 2020-10-22 MED ORDER — PROMETHAZINE HCL 25 MG/ML IJ SOLN
25.0000 mg | Freq: Once | INTRAMUSCULAR | Status: AC
  Administered 2020-10-22: 25 mg via INTRAMUSCULAR

## 2020-10-22 NOTE — Assessment & Plan Note (Signed)
-  IM toradol and phenergan today -PO benadryl provided -Rx. fioricet PRN -if no improvement with medication, return to clinic; would consider neuro consult at that time

## 2020-10-22 NOTE — Patient Instructions (Signed)
You were seen today for migraine. We gave you medication to help your pain.  It will likely make you sleepy, so go straight home.  I also sent in fioricet in case the pain returns.  If the migraine comes back 2 days out of the week or 4 days out of the next month, please return to the clinic.

## 2020-10-22 NOTE — Addendum Note (Signed)
Addended by: Dellia Cloud on: 10/22/2020 01:31 PM   Modules accepted: Orders

## 2020-10-22 NOTE — Progress Notes (Signed)
Acute Office Visit  Subjective:    Patient ID: Shane Pena, male    DOB: 1981/07/11, 40 y.o.   MRN: [id number]  Chief Complaint  Patient presents with  . Headache    X 4-5 days     HPI Patient is in today for headache x 4-5 days. He has tried BC powder and advil, but this has helped very much.  It eases the pain, but then the headache comes back.  He has been checking his BP, and his BP has been fine when he checks it. He states it has come and gone, and he has some photosensitivity. He rates his pain at 4-5 after taking advil.  His pain is at his temples on both sides, but when it is most severe he has right-sided pain that is worse behind his eye.  Past Medical History:  Diagnosis Date  . Allergy   . GERD (gastroesophageal reflux disease)     Past Surgical History:  Procedure Laterality Date  . ABDOMINAL SURGERY      History reviewed. No pertinent family history.  Social History   Socioeconomic History  . Marital status: Significant Other    Spouse name: Not on file  . Number of children: Not on file  . Years of education: Not on file  . Highest education level: Not on file  Occupational History  . Not on file  Tobacco Use  . Smoking status: Current Every Day Smoker    Packs/day: 0.25    Types: Cigarettes  . Smokeless tobacco: Never Used  Vaping Use  . Vaping Use: Never used  Substance and Sexual Activity  . Alcohol use: No  . Drug use: No  . Sexual activity: Yes    Birth control/protection: None  Other Topics Concern  . Not on file  Social History Narrative   Lives with [name]    No children   Dog: Bella-pit bull      Enjoys: fishing, sports-cowboys fan       Diet: [place] and chicken, veggies,    Caffeine: no soda, limited maybe once a week   Water: 8 cups daily       Wears seat belt    Smoke detectors in homes   Does not use phone while driving   Social Determinants of Health   Financial Resource Strain: Low Risk   . Difficulty of  Paying Living Expenses: Not hard at all  Food Insecurity: No Food Insecurity  . Worried About Programme researcher, broadcasting/film/video in the Last Year: Never true  . Ran Out of Food in the Last Year: Never true  Transportation Needs: No Transportation Needs  . Lack of Transportation (Medical): No  . Lack of Transportation (Non-Medical): No  Physical Activity: Sufficiently Active  . Days of Exercise per Week: 4 days  . Minutes of Exercise per Session: 60 min  Stress: No Stress Concern Present  . Feeling of Stress : Not at all  Social Connections: Moderately Integrated  . Frequency of Communication with Friends and Family: More than three times a week  . Frequency of Social Gatherings with Friends and Family: More than three times a week  . Attends Religious Services: More than 4 times per year  . Active Member of Clubs or Organizations: No  . Attends Banker Meetings: Never  . Marital Status: Living with partner  Intimate Partner Violence: Not At Risk  . Fear of Current or Ex-Partner: No  . Emotionally Abused: No  .  Physically Abused: No  . Sexually Abused: No    Outpatient Medications Prior to Visit  Medication Sig Dispense Refill  . chlorhexidine (HIBICLENS) 4 % external liquid Apply topically daily as needed. 120 mL 0  . cyclobenzaprine (FLEXERIL) 10 MG tablet Take 1 tablet (10 mg total) by mouth at bedtime as needed for muscle spasms. 30 tablet 0  . fluticasone (FLONASE) 50 MCG/ACT nasal spray Place 1 spray into both nostrils daily. 16 g 2  . ketoconazole (NIZORAL) 2 % cream Apply 1 application topically daily. 15 g 0  . levocetirizine (XYZAL) 5 MG tablet Take 1 tablet (5 mg total) by mouth every evening. 90 tablet 1  . omeprazole (PRILOSEC) 20 MG capsule Take 1 capsule (20 mg total) by mouth daily. 30 capsule 0  . pantoprazole (PROTONIX) 40 MG tablet Take 1 tablet (40 mg total) by mouth daily. 90 tablet 1   No facility-administered medications prior to visit.    Allergies   Allergen Reactions  . Other Itching and Rash  . Pineapple     Itching     Review of Systems  Constitutional: Negative.   Respiratory: Negative.   Cardiovascular: Negative.   Neurological: Positive for headaches.       R>L with photosensitivity per HPI       Objective:    Physical Exam Constitutional:      Appearance: He is well-developed.  Cardiovascular:     Heart sounds: Normal heart sounds.  Pulmonary:     Effort: Pulmonary effort is normal.     Breath sounds: Normal breath sounds.  Neurological:     Mental Status: He is alert and oriented to person, place, and time.     GCS: GCS eye subscore is 4. GCS verbal subscore is 5. GCS motor subscore is 6.     Cranial Nerves: No cranial nerve deficit, dysarthria or facial asymmetry.     Sensory: No sensory deficit.     Motor: No weakness.     Gait: Gait normal.     BP 130/83   Pulse 88   Temp 99.3 F (37.4 C)   Resp 18   Ht 6' (1.829 m)   Wt 266 lb (120.7 kg)   SpO2 94%   BMI 36.08 kg/m  Wt Readings from Last 3 Encounters:  10/22/20 266 lb (120.7 kg)  08/06/20 257 lb 6.4 oz (116.8 kg)  06/14/20 259 lb (117.5 kg)    Health Maintenance Due  Topic Date Due  . Hepatitis C Screening  Never done  . COVID-19 Vaccine (1) Never done  . HIV Screening  Never done  . TETANUS/TDAP  Never done  . INFLUENZA VACCINE  Never done    There are no preventive care reminders to display for this patient.   No results found for: TSH Lab Results  Component Value Date   WBC 8.4 12/08/2019   HGB 13.8 12/08/2019   HCT 41.7 12/08/2019   MCV 86.3 12/08/2019   PLT 399 12/08/2019   Lab Results  Component Value Date   NA 139 12/08/2019   K 4.6 12/08/2019   CO2 26 12/08/2019   GLUCOSE 102 (H) 12/08/2019   BUN 10 12/08/2019   CREATININE 1.18 12/08/2019   BILITOT 0.2 12/08/2019   AST 18 12/08/2019   ALT 14 12/08/2019   PROT 7.0 12/08/2019   CALCIUM 9.9 12/08/2019   Lab Results  Component Value Date   CHOL 193  12/08/2019   Lab Results  Component Value Date  HDL 37 (L) 12/08/2019   Lab Results  Component Value Date   LDLCALC 130 (H) 12/08/2019   Lab Results  Component Value Date   TRIG 150 (H) 12/08/2019   Lab Results  Component Value Date   CHOLHDL 5.2 (H) 12/08/2019   Lab Results  Component Value Date   HGBA1C 5.5 12/08/2019       Assessment & Plan:   Problem List Items Addressed This Visit      Cardiovascular and Mediastinum   Migraine    -IM toradol and phenergan today -PO benadryl provided -Rx. fioricet PRN -if no improvement with medication, return to clinic; would consider neuro consult at that time      Relevant Medications   butalbital-acetaminophen-caffeine (FIORICET) 50-325-40 MG tablet       Meds ordered this encounter  Medications  . butalbital-acetaminophen-caffeine (FIORICET) 50-325-40 MG tablet    Sig: Take 1-2 tablets by mouth every 6 (six) hours as needed for headache.    Dispense:  20 tablet    Refill:  0     Heather Roberts, NP

## 2020-11-10 ENCOUNTER — Other Ambulatory Visit: Payer: Self-pay | Admitting: Internal Medicine

## 2020-11-10 ENCOUNTER — Other Ambulatory Visit: Payer: Self-pay | Admitting: Family Medicine

## 2020-11-10 DIAGNOSIS — M62838 Other muscle spasm: Secondary | ICD-10-CM

## 2020-11-11 MED ORDER — OMEPRAZOLE 20 MG PO CPDR: 20.0000 mg | DELAYED_RELEASE_CAPSULE | Freq: Every day | ORAL | 0 refills | Status: DC

## 2020-11-11 MED ORDER — CYCLOBENZAPRINE HCL 10 MG PO TABS: 10.0000 mg | ORAL_TABLET | Freq: Every evening | ORAL | 0 refills | Status: DC | PRN

## 2020-12-02 ENCOUNTER — Encounter: Payer: Self-pay | Admitting: Family Medicine

## 2020-12-02 ENCOUNTER — Other Ambulatory Visit: Payer: Self-pay

## 2020-12-02 DIAGNOSIS — J3089 Other allergic rhinitis: Secondary | ICD-10-CM

## 2020-12-02 MED ORDER — LEVOCETIRIZINE DIHYDROCHLORIDE 5 MG PO TABS: 5.0000 mg | ORAL_TABLET | Freq: Every evening | ORAL | 1 refills | Status: DC

## 2020-12-02 MED ORDER — FLUTICASONE PROPIONATE 50 MCG/ACT NA SUSP: 1.0000 | Freq: Every day | NASAL | 2 refills | Status: DC

## 2020-12-06 ENCOUNTER — Other Ambulatory Visit: Payer: Self-pay | Admitting: Family Medicine

## 2020-12-09 MED ORDER — OMEPRAZOLE 20 MG PO CPDR: 20.0000 mg | DELAYED_RELEASE_CAPSULE | Freq: Every day | ORAL | 0 refills | Status: DC

## 2021-01-10 ENCOUNTER — Other Ambulatory Visit: Payer: Self-pay | Admitting: Family Medicine

## 2021-01-13 MED ORDER — OMEPRAZOLE 20 MG PO CPDR: 20.0000 mg | DELAYED_RELEASE_CAPSULE | Freq: Every day | ORAL | 0 refills | Status: DC

## 2021-06-20 ENCOUNTER — Encounter: Payer: BC Managed Care – PPO | Admitting: Family Medicine

## 2021-06-20 ENCOUNTER — Encounter: Payer: BC Managed Care – PPO | Admitting: Nurse Practitioner

## 2021-12-03 ENCOUNTER — Other Ambulatory Visit: Payer: Self-pay

## 2021-12-03 ENCOUNTER — Ambulatory Visit: Payer: Self-pay | Admitting: Family Medicine

## 2021-12-03 ENCOUNTER — Ambulatory Visit (INDEPENDENT_AMBULATORY_CARE_PROVIDER_SITE_OTHER): Payer: Self-pay | Admitting: Nurse Practitioner

## 2021-12-03 ENCOUNTER — Encounter: Payer: Self-pay | Admitting: Nurse Practitioner

## 2021-12-03 VITALS — BP 142/102 | HR 93 | Ht 72.0 in | Wt 257.0 lb

## 2021-12-03 DIAGNOSIS — Z2821 Immunization not carried out because of patient refusal: Secondary | ICD-10-CM

## 2021-12-03 DIAGNOSIS — E782 Mixed hyperlipidemia: Secondary | ICD-10-CM

## 2021-12-03 DIAGNOSIS — J3089 Other allergic rhinitis: Secondary | ICD-10-CM

## 2021-12-03 DIAGNOSIS — I10 Essential (primary) hypertension: Secondary | ICD-10-CM

## 2021-12-03 DIAGNOSIS — F17200 Nicotine dependence, unspecified, uncomplicated: Secondary | ICD-10-CM

## 2021-12-03 DIAGNOSIS — Z139 Encounter for screening, unspecified: Secondary | ICD-10-CM

## 2021-12-03 DIAGNOSIS — K219 Gastro-esophageal reflux disease without esophagitis: Secondary | ICD-10-CM

## 2021-12-03 DIAGNOSIS — E669 Obesity, unspecified: Secondary | ICD-10-CM

## 2021-12-03 MED ORDER — LEVOCETIRIZINE DIHYDROCHLORIDE 5 MG PO TABS: 5.0000 mg | ORAL_TABLET | Freq: Every evening | ORAL | 1 refills | Status: DC

## 2021-12-03 MED ORDER — OMEPRAZOLE 20 MG PO CPDR: 20.0000 mg | DELAYED_RELEASE_CAPSULE | Freq: Every day | ORAL | 3 refills | Status: DC

## 2021-12-03 MED ORDER — FLUTICASONE PROPIONATE 50 MCG/ACT NA SUSP: 1.0000 | Freq: Every day | NASAL | 2 refills | Status: DC

## 2021-12-03 NOTE — Assessment & Plan Note (Signed)
Need for flu vaccine discussed with patient he verbalized understanding, patient refused flu vaccine today ?

## 2021-12-03 NOTE — Progress Notes (Signed)
? ?  Shane Pena     MRN: [id number]      DOB: 1980/10/22 ? ? ?HPI ?Shane Pena with medical history of hypertension, impaired fasting glucose, obesity, current smoker is here for follow up visit to urgent care three days ago for High blood pressure.Pt states that his BP was up 3 days ago , he went to urgent care 2 days ago he was placed on blood pressure medicine which he started yesterday. He had some HA on saturday took some ibuprofen  and the HA went away. patient stated that no EKG was done for him at the urgent care. Pt denies CP, SOB, dizziness. He has not taken his BP med today ? ?Pt is due for flu vaccine, tdap vaccine and covid vaccines, need for all vaccines discsued with pt he verbalized understanding.  ? ? ? ?ROS ?Denies recent fever or chills. ?Denies sinus pressure, nasal congestion, ear pain or sore throat. ?Denies chest congestion, productive cough or wheezing. ?Denies chest pains, palpitations and leg swelling ?Denies abdominal pain, nausea, vomiting,diarrhea or constipation.   ?Denies joint pain, swelling and limitation in mobility. ?Denies headaches, seizures, numbness, or tingling. ?Denies depression, anxiety or insomnia. ? ? ?PE ? ?BP (!) 142/102 (BP Location: Right Arm, Cuff Size: Large)   Pulse 93   Ht 6' (1.829 m)   Wt 257 lb (116.6 kg)   SpO2 97%   BMI 34.86 kg/m?  ? ?Patient alert and oriented and in no cardiopulmonary distress. ? ?Chest: Clear to auscultation bilaterally. ? ?CVS: S1, S2 no murmurs, no S3.Regular rate. ? ?ABD: Soft non tender.  ? ?Ext: No edema ? ?MS: Adequate ROM spine, shoulders, hips and knees. ? ?Psych: Good eye contact, normal affect. Memory intact not anxious or depressed appearing. ? ? ?Assessment & Plan ?Current smoker ?Used to smoke one pack daily, now a pack last 2-3 days. He has been smoking for about 23 years. needed to quit smoking discussed with pt, he verbalized understanding, pt will work on cutting back in smoking with the aim of  quitting. ? ?Gastroesophageal reflux disease ?Condition well-controlled ?Takes omeprazole 20 mg daily ?Patient told to take medication only when needed he verbalized understanding ?Medication refill today ? ?Refused influenza vaccine ?Need for flu vaccine discussed with patient he verbalized understanding, patient refused flu vaccine today ? ?Environmental and seasonal allergies ?Chronic condition well-controlled ?Takes Flonase nasal spray 1 spray daily, Xyzal 5 mg daily ?Medications refilled today ? ?Obesity (BMI 30-39.9) ?Wt Readings from Last 3 Encounters:  ?12/03/21 257 lb (116.6 kg)  ?10/22/20 266 lb (120.7 kg)  ?08/06/20 257 lb 6.4 oz (116.8 kg)  ?Patient states that he goes to the gym and spends 30 to 40 minutes daily exercising he does not eat pork and fried foods. ?Patient encouraged to increase intake of whole foods consisting mainly of vegetables and protein, less carb to help maintain healthy weight patient verbalized understanding. ? ?Essential hypertension ?BP Readings from Last 3 Encounters:  ?12/03/21 (!) 142/102  ?10/22/20 130/83  ?08/06/20 (!) 137/97  ?Recently started on amlodipine 5 mg daily at the urgent care. ?Continue amlodipine 5 mg daily ?CMP plus EGFR before next visit in 4 weeks ?DASH diet advised, patient advised on vigorous exercise 30 minutes to 60 minutes 5 days to 7 days a week to help maintain a healthy weight and control blood pressure patient verbalized understanding. ?EKG obtained today, shows SR, Right atrial enlargement, rate 67 ?  ? ?

## 2021-12-03 NOTE — Assessment & Plan Note (Deleted)
BP Readings from Last 3 Encounters:  ?12/03/21 (!) 142/102  ?10/22/20 130/83  ?08/06/20 (!) 137/97  ? ?

## 2021-12-03 NOTE — Assessment & Plan Note (Signed)
Chronic condition well-controlled ?Takes Flonase nasal spray 1 spray daily, Xyzal 5 mg daily ?Medications refilled today ?

## 2021-12-03 NOTE — Assessment & Plan Note (Signed)
Wt Readings from Last 3 Encounters:  ?12/03/21 257 lb (116.6 kg)  ?10/22/20 266 lb (120.7 kg)  ?08/06/20 257 lb 6.4 oz (116.8 kg)  ?Patient states that he goes to the gym and spends 30 to 40 minutes daily exercising he does not eat pork and fried foods. ?Patient encouraged to increase intake of whole foods consisting mainly of vegetables and protein, less carb to help maintain healthy weight patient verbalized understanding. ?

## 2021-12-03 NOTE — Patient Instructions (Signed)
Please continue to take amlodipine 5mg  daily.  ? ?It is important that you exercise regularly at least 30 minutes 5 times a week.  ?Think about what you will eat, plan ahead. ?Choose " clean, green, fresh or frozen" over canned, processed or packaged foods which are more sugary, salty and fatty. ?70 to 75% of food eaten should be vegetables and fruit. ?Three meals at set times with snacks allowed between meals, but they must be fruit or vegetables. ?Aim to eat over a 12 hour period , example 7 am to 7 pm, and STOP after  your last meal of the day. ?Drink water,generally about 64 ounces per day, no other drink is as healthy. Fruit juice is best enjoyed in a healthy way, by EATING the fruit. ? ?Thanks for choosing [facility], we consider it a privelige to serve you. ? ?

## 2021-12-03 NOTE — Assessment & Plan Note (Signed)
BP Readings from Last 3 Encounters:  ?12/03/21 (!) 142/102  ?10/22/20 130/83  ?08/06/20 (!) 137/97  ?Recently started on amlodipine 5 mg daily at the urgent care. ?Continue amlodipine 5 mg daily ?CMP plus EGFR before next visit in 4 weeks ?DASH diet advised, patient advised on vigorous exercise 30 minutes to 60 minutes 5 days to 7 days a week to help maintain a healthy weight and control blood pressure patient verbalized understanding. ?EKG obtained today, shows SR, Right atrial enlargement, rate 67 ? ?

## 2021-12-03 NOTE — Assessment & Plan Note (Signed)
Condition well-controlled ?Takes omeprazole 20 mg daily ?Patient told to take medication only when needed he verbalized understanding ?Medication refill today ?

## 2021-12-03 NOTE — Assessment & Plan Note (Signed)
Used to smoke one pack daily, now a pack last 2-3 days. He has been smoking for about 23 years. needed to quit smoking discussed with pt, he verbalized understanding, pt will work on cutting back in smoking with the aim of quitting. ?

## 2021-12-06 ENCOUNTER — Encounter: Payer: Self-pay | Admitting: Nurse Practitioner

## 2021-12-08 NOTE — Telephone Encounter (Signed)
Please advise 

## 2021-12-10 NOTE — Telephone Encounter (Signed)
Spoke with pt 3/14 advised of below pt verbalized understanding ?

## 2021-12-30 ENCOUNTER — Other Ambulatory Visit: Payer: Self-pay | Admitting: Nurse Practitioner

## 2021-12-31 ENCOUNTER — Other Ambulatory Visit: Payer: Self-pay

## 2021-12-31 MED ORDER — AMLODIPINE BESYLATE 5 MG PO TABS
5.0000 mg | ORAL_TABLET | Freq: Every day | ORAL | 0 refills | Status: DC
  Filled 2021-12-31 – 2022-02-01 (×2): qty 30, 30d supply, fill #0

## 2022-01-12 ENCOUNTER — Encounter: Payer: Self-pay | Admitting: Nurse Practitioner

## 2022-01-13 ENCOUNTER — Other Ambulatory Visit: Payer: Self-pay

## 2022-01-14 ENCOUNTER — Encounter: Payer: Self-pay | Admitting: Nurse Practitioner

## 2022-01-14 LAB — CMP14+EGFR
ALT: 18 IU/L (ref 0–44)
AST: 23 IU/L (ref 0–40)
Albumin/Globulin Ratio: 2 (ref 1.2–2.2)
Albumin: 4.7 g/dL (ref 4.0–5.0)
Alkaline Phosphatase: 83 IU/L (ref 44–121)
BUN/Creatinine Ratio: 8 — ABNORMAL LOW (ref 9–20)
BUN: 10 mg/dL (ref 6–24)
Bilirubin Total: 0.4 mg/dL (ref 0.0–1.2)
CO2: 23 mmol/L (ref 20–29)
Calcium: 9.8 mg/dL (ref 8.7–10.2)
Chloride: 103 mmol/L (ref 96–106)
Creatinine, Ser: 1.19 mg/dL (ref 0.76–1.27)
Globulin, Total: 2.4 g/dL (ref 1.5–4.5)
Glucose: 101 mg/dL — ABNORMAL HIGH (ref 70–99)
Potassium: 4.4 mmol/L (ref 3.5–5.2)
Sodium: 140 mmol/L (ref 134–144)
Total Protein: 7.1 g/dL (ref 6.0–8.5)
eGFR: 79 mL/min/{1.73_m2} (ref >59)

## 2022-01-14 LAB — CBC WITH DIFFERENTIAL/PLATELET
Basophils Absolute: 0 10*3/uL (ref 0.0–0.2)
Basos: 1 %
EOS (ABSOLUTE): 0.1 10*3/uL (ref 0.0–0.4)
Eos: 1 %
Hematocrit: 46.1 % (ref 37.5–51.0)
Hemoglobin: 15.8 g/dL (ref 13.0–17.7)
Immature Grans (Abs): 0 10*3/uL (ref 0.0–0.1)
Immature Granulocytes: 0 %
Lymphocytes Absolute: 2.5 10*3/uL (ref 0.7–3.1)
Lymphs: 31 %
MCH: 30 pg (ref 26.6–33.0)
MCHC: 34.3 g/dL (ref 31.5–35.7)
MCV: 88 fL (ref 79–97)
Monocytes Absolute: 0.5 10*3/uL (ref 0.1–0.9)
Monocytes: 6 %
Neutrophils Absolute: 5 10*3/uL (ref 1.4–7.0)
Neutrophils: 61 %
Platelets: 352 10*3/uL (ref 150–450)
RBC: 5.26 x10E6/uL (ref 4.14–5.80)
RDW: 13.1 % (ref 11.6–15.4)
WBC: 8.1 10*3/uL (ref 3.4–10.8)

## 2022-01-14 LAB — HEMOGLOBIN A1C
Est. average glucose Bld gHb Est-mCnc: 123 mg/dL
Hgb A1c MFr Bld: 5.9 % — ABNORMAL HIGH (ref 4.8–5.6)

## 2022-01-14 LAB — LIPID PANEL
Chol/HDL Ratio: 5.1 ratio — ABNORMAL HIGH (ref 0.0–5.0)
Cholesterol, Total: 203 mg/dL — ABNORMAL HIGH (ref 100–199)
HDL: 40 mg/dL (ref >39)
LDL Chol Calc (NIH): 133 mg/dL — ABNORMAL HIGH (ref 0–99)
Triglycerides: 167 mg/dL — ABNORMAL HIGH (ref 0–149)
VLDL Cholesterol Cal: 30 mg/dL (ref 5–40)

## 2022-01-14 LAB — VITAMIN D 25 HYDROXY (VIT D DEFICIENCY, FRACTURES): Vit D, 25-Hydroxy: 31.3 ng/mL (ref 30.0–100.0)

## 2022-01-14 LAB — TSH: TSH: 0.811 u[IU]/mL (ref 0.450–4.500)

## 2022-01-14 NOTE — Progress Notes (Signed)
Will discuss results with patient at his upcoming appointment

## 2022-01-20 ENCOUNTER — Ambulatory Visit (INDEPENDENT_AMBULATORY_CARE_PROVIDER_SITE_OTHER): Payer: 59 | Admitting: Nurse Practitioner

## 2022-01-20 ENCOUNTER — Encounter: Payer: Self-pay | Admitting: Nurse Practitioner

## 2022-01-20 VITALS — BP 138/90 | HR 94 | Ht 72.0 in | Wt 264.0 lb

## 2022-01-20 DIAGNOSIS — F17200 Nicotine dependence, unspecified, uncomplicated: Secondary | ICD-10-CM

## 2022-01-20 DIAGNOSIS — Z0000 Encounter for general adult medical examination without abnormal findings: Secondary | ICD-10-CM | POA: Diagnosis not present

## 2022-01-20 DIAGNOSIS — I10 Essential (primary) hypertension: Secondary | ICD-10-CM

## 2022-01-20 DIAGNOSIS — E785 Hyperlipidemia, unspecified: Secondary | ICD-10-CM | POA: Insufficient documentation

## 2022-01-20 DIAGNOSIS — E669 Obesity, unspecified: Secondary | ICD-10-CM

## 2022-01-20 DIAGNOSIS — Z23 Encounter for immunization: Secondary | ICD-10-CM | POA: Diagnosis not present

## 2022-01-20 DIAGNOSIS — E782 Mixed hyperlipidemia: Secondary | ICD-10-CM | POA: Diagnosis not present

## 2022-01-20 DIAGNOSIS — R7303 Prediabetes: Secondary | ICD-10-CM

## 2022-01-20 LAB — HIV ANTIBODY (ROUTINE TESTING W REFLEX): HIV Screen 4th Generation wRfx: NONREACTIVE

## 2022-01-20 LAB — HEPATITIS C ANTIBODY: Hep C Virus Ab: NONREACTIVE

## 2022-01-20 MED ORDER — ROSUVASTATIN CALCIUM 5 MG PO TABS: 5.0000 mg | ORAL_TABLET | Freq: Every day | ORAL | 3 refills | Status: DC

## 2022-01-20 NOTE — Progress Notes (Signed)
? ?Complete physical exam ? ?Patient: Shane Pena   DOB: [birth year]   40 y.o. Male  MRN: [id number] ? ?Subjective:  ?  ?Chief Complaint  ?Patient presents with  ? Annual Exam  ?  CPE  ? ? ?Shane Pena is a 41 y.o. male with past medical history of hypertension, GERD, obesity, current smoker who presents today for a complete physical exam. He reports consuming a low sodium diet. Gym/ health club routine includes stationary bike and treadmill. He generally feels well. He reports sleeping well. He does not have additional problems to discuss today.  ? ? ?He does not wear glasses, does not see an opthamologist ,denies trouble with his vision,  gets free teeth cleaning at work every year.  ? ?Due for TDAP vaccine , pt will get the vaccine today.  ? ? ?Continues to smoke cigarettes, a pack last him about 3 days now , he sued to smoke about a pack a day. He wants to continue to cut down . One of his friends just quit some days ago and he believe he can do it too. Refused smoking cessation medications .   ? ?Most recent fall risk assessment: ? ?  01/20/2022  ?  8:55 AM  ?Fall Risk   ?Falls in the past year? 0  ?Number falls in past yr: 0  ?Injury with Fall? 0  ?Risk for fall due to : No Fall Risks  ?Follow up Falls evaluation completed  ? ?  ?Most recent depression screenings: ? ?  01/20/2022  ?  8:55 AM 12/03/2021  ?  8:57 AM  ?PHQ 2/9 Scores  ?PHQ - 2 Score 0 0  ? ? ? ? ? ? ?Patient Care Team: ?Donell Beers, FNP as PCP - General (Nurse Practitioner)  ? ?Outpatient Medications Prior to Visit  ?Medication Sig  ? amLODipine (NORVASC) 5 MG tablet Take 1 tablet (5 mg total) by mouth daily.  ? fluticasone (FLONASE) 50 MCG/ACT nasal spray Place 1 spray into both nostrils daily.  ? levocetirizine (XYZAL) 5 MG tablet Take 1 tablet (5 mg total) by mouth every evening.  ? omeprazole (PRILOSEC) 20 MG capsule Take 1 capsule (20 mg total) by mouth daily.  ? cyclobenzaprine (FLEXERIL) 10 MG tablet Take 1 tablet (10 mg  total) by mouth at bedtime as needed for muscle spasms. (Patient not taking: Reported on 12/03/2021)  ? ?No facility-administered medications prior to visit.  ? ? ?Review of Systems  ?Constitutional: Negative.   ?HENT: Negative.    ?Eyes: Negative.   ?Respiratory: Negative.    ?Cardiovascular: Negative.   ?Gastrointestinal: Negative.   ?Genitourinary: Negative.   ?Musculoskeletal: Negative.   ?Neurological: Negative.   ?Endo/Heme/Allergies: Negative.   ?Psychiatric/Behavioral: Negative.    ? ? ? ? ?   ?Objective:  ? ?  ?BP 138/90 (BP Location: Left Arm, Cuff Size: Large)   Pulse 94   Ht 6' (1.829 m)   Wt 264 lb (119.7 kg)   SpO2 97%   BMI 35.80 kg/m?  ? ? ?Physical Exam ?Constitutional:   ?   General: He is not in acute distress. ?   Appearance: He is obese. He is not ill-appearing, toxic-appearing or diaphoretic.  ?HENT:  ?   Head: Normocephalic and atraumatic.  ?   Right Ear: Tympanic membrane, ear canal and external ear normal. There is no impacted cerumen.  ?   Left Ear: Ear canal and external ear normal. There is no impacted cerumen.  ?  Nose: Nose normal. No congestion or rhinorrhea.  ?   Mouth/Throat:  ?   Mouth: Mucous membranes are moist.  ?   Pharynx: Oropharynx is clear. No oropharyngeal exudate or posterior oropharyngeal erythema.  ?Eyes:  ?   General: No scleral icterus.    ?   Right eye: No discharge.     ?   Left eye: No discharge.  ?   Extraocular Movements: Extraocular movements intact.  ?   Conjunctiva/sclera: Conjunctivae normal.  ?   Pupils: Pupils are equal, round, and reactive to light.  ?Neck:  ?   Vascular: No carotid bruit.  ?Cardiovascular:  ?   Rate and Rhythm: Normal rate and regular rhythm.  ?   Pulses: Normal pulses.  ?   Heart sounds: Normal heart sounds. No murmur heard. ?  No friction rub. No gallop.  ?Pulmonary:  ?   Effort: Pulmonary effort is normal. No respiratory distress.  ?   Breath sounds: Normal breath sounds. No stridor. No wheezing, rhonchi or rales.  ?Chest:  ?    Chest wall: No tenderness.  ?Abdominal:  ?   General: There is no distension.  ?   Palpations: Abdomen is soft. There is no mass.  ?   Tenderness: There is no abdominal tenderness. There is no right CVA tenderness, left CVA tenderness, guarding or rebound.  ?   Hernia: No hernia is present.  ?Musculoskeletal:     ?   General: No swelling, tenderness, deformity or signs of injury.  ?   Cervical back: Normal range of motion and neck supple. No rigidity or tenderness.  ?   Right lower leg: No edema.  ?   Left lower leg: No edema.  ?Lymphadenopathy:  ?   Cervical: No cervical adenopathy.  ?Skin: ?   General: Skin is warm and dry.  ?   Capillary Refill: Capillary refill takes less than 2 seconds.  ?   Coloration: Skin is not jaundiced or pale.  ?   Findings: No bruising, erythema, lesion or rash.  ?Neurological:  ?   Mental Status: He is alert and oriented to person, place, and time.  ?   Cranial Nerves: No cranial nerve deficit.  ?   Sensory: No sensory deficit.  ?   Motor: No weakness.  ?   Coordination: Coordination normal.  ?   Gait: Gait normal.  ?   Deep Tendon Reflexes: Reflexes normal.  ?Psychiatric:     ?   Mood and Affect: Mood normal.     ?   Behavior: Behavior normal.     ?   Thought Content: Thought content normal.     ?   Judgment: Judgment normal.  ?  ? ?No results found for any visits on 01/20/22. ? ?   ?Assessment & Plan:  ?  ?Routine Health Maintenance and Physical Exam ? ?Immunization History  ?Administered Date(s) Administered  ? Tdap 01/20/2022  ? ? ?Health Maintenance  ?Topic Date Due  ? TETANUS/TDAP  01/21/2032  ? Hepatitis C Screening  Completed  ? HIV Screening  Completed  ? HPV VACCINES  Aged Out  ? INFLUENZA VACCINE  Discontinued  ? COVID-19 Vaccine  Discontinued  ? ? ?Discussed health benefits of physical activity, and encouraged him to engage in regular exercise appropriate for his age and condition. ? ?Problem List Items Addressed This Visit   ? ?  ? Cardiovascular and Mediastinum  ?  Essential hypertension  ?  BP Readings from Last 3 Encounters:  ?  01/20/22 138/90  ?12/03/21 (!) 142/102  ?10/22/20 130/83  ?Currently on amlodipine 5 mg daily ?Continue current medication ?DASH diet advised engage in daily vigorous exercise and at least 150 minutes weekly ?  ?  ? Relevant Medications  ? rosuvastatin (CRESTOR) 5 MG tablet  ?  ? Other  ? Obesity (BMI 30-39.9)  ?  Wt Readings from Last 3 Encounters:  ?01/20/22 264 lb (119.7 kg)  ?12/03/21 257 lb (116.6 kg)  ?10/22/20 266 lb (120.7 kg)  ?has added about 7 pounds since last visit ?States that he has been going to the gym and stay active at work ?Need to increase intake of whole foods consisting mainly of vegetables and protein less carbohydrate drinking at least 64 ounces of water daily engaging in daily vigorous exercising at least 30 minutes discussed with patient.  Patient refused medications for weight loss management. ?  ?  ? Current smoker  ?  Need to quit smoking discussed with patient today states that 1 pack of cigarettes lasts him 3 to 4 days and that he will continue to quit in.  Patient refused smoking cessation medications ? ?  ?  ? Annual physical exam - Primary  ?  Annual exam as documented.  ?Counseling done include healthy lifestyle involving committing to 150 minutes of exercise per week, heart healthy diet, and attaining healthy weight. The importance of adequate sleep also discussed.  ?Regular use of seat belt and home safety were also discussed . ?Changes in health habits are decided on by patient with goals and time frames set for achieving them. ?Immunization needs are specifically addressed at this visit.   ? ?  ?  ? Hyperlipidemia  ?  The 10-year ASCVD risk score (Arnett DK, et al., 2019) is: 10.8% ?  Values used to calculate the score: ?    Age: 59 years ?    Sex: Male ?    Is Non-Hispanic African American: Yes ?    Diabetic: No ?    Tobacco smoker: Yes ?    Systolic Blood Pressure: 138 mmHg ?    Is BP treated: Yes ?    HDL  Cholesterol: 40 mg/dL ?    Total Cholesterol: 203 mg/dL  ?LDL goal is less than 100 due to smoking and HTN and ascvd risk score . ?Start Crestor 5 mg daily, follow-up in 8 weeks. ? ?Lab Results  ?Component Value Date  ? CHO

## 2022-01-20 NOTE — Assessment & Plan Note (Signed)
Need to quit smoking discussed with patient today states that 1 pack of cigarettes lasts him 3 to 4 days and that he will continue to quit in.  Patient refused smoking cessation medications ?

## 2022-01-20 NOTE — Assessment & Plan Note (Signed)
BP Readings from Last 3 Encounters:  ?01/20/22 138/90  ?12/03/21 (!) 142/102  ?10/22/20 130/83  ?Currently on amlodipine 5 mg daily ?Continue current medication ?DASH diet advised engage in daily vigorous exercise and at least 150 minutes weekly ?

## 2022-01-20 NOTE — Assessment & Plan Note (Signed)
Patient educated on CDC recommendation for the vaccine. Verbal consent was obtained from the patient, vaccine administered by nurse, no sign of adverse reactions noted at this time. Patient education on arm soreness and use of tylenol  for this patient  was discussed. Patient educated on the signs and symptoms of adverse effect and advise to contact the office if they occur.  ?

## 2022-01-20 NOTE — Patient Instructions (Addendum)
Please start taking crestor 5mg  daily for your high cholesterol. ?Please get your TDAP vaccine today  ? ?It is important that you exercise regularly at least 30 minutes 5 times a week.  ?Think about what you will eat, plan ahead. ?Choose " clean, green, fresh or frozen" over canned, processed or packaged foods which are more sugary, salty and fatty. ?70 to 75% of food eaten should be vegetables and fruit. ?Three meals at set times with snacks allowed between meals, but they must be fruit or vegetables. ?Aim to eat over a 12 hour period , example 7 am to 7 pm, and STOP after  your last meal of the day. ?Drink water,generally about 64 ounces per day, no other drink is as healthy. Fruit juice is best enjoyed in a healthy way, by EATING the fruit. ? ?Thanks for choosing [facility], we consider it a privelige to serve you. ? ?

## 2022-01-20 NOTE — Assessment & Plan Note (Addendum)
The 10-year ASCVD risk score (Arnett DK, et al., 2019) is: 10.8% ?  Values used to calculate the score: ?    Age: 41 years ?    Sex: Male ?    Is Non-Hispanic African American: Yes ?    Diabetic: No ?    Tobacco smoker: Yes ?    Systolic Blood Pressure: 138 mmHg ?    Is BP treated: Yes ?    HDL Cholesterol: 40 mg/dL ?    Total Cholesterol: 203 mg/dL  ?LDL goal is less than 100 due to smoking and HTN and ascvd risk score . ?Start Crestor 5 mg daily, follow-up in 8 weeks. ? ?Lab Results  ?Component Value Date  ? CHOL 203 (H) 01/13/2022  ? HDL 40 01/13/2022  ? LDLCALC 133 (H) 01/13/2022  ? TRIG 167 (H) 01/13/2022  ? CHOLHDL 5.1 (H) 01/13/2022  ? ?

## 2022-01-20 NOTE — Assessment & Plan Note (Signed)
Lab Results  ?Component Value Date  ? HGBA1C 5.9 (H) 01/13/2022  ? ?Avoid sugar sweets soda engage in regular daily vigorous exercise ?

## 2022-01-20 NOTE — Assessment & Plan Note (Signed)
Annual exam as documented.  ?Counseling done include healthy lifestyle involving committing to 150 minutes of exercise per week, heart healthy diet, and attaining healthy weight. The importance of adequate sleep also discussed.  ?Regular use of seat belt and home safety were also discussed . ?Changes in health habits are decided on by patient with goals and time frames set for achieving them. ?Immunization needs are specifically addressed at this visit.   ?

## 2022-01-20 NOTE — Assessment & Plan Note (Signed)
Wt Readings from Last 3 Encounters:  ?01/20/22 264 lb (119.7 kg)  ?12/03/21 257 lb (116.6 kg)  ?10/22/20 266 lb (120.7 kg)  ?has added about 7 pounds since last visit ?States that he has been going to the gym and stay active at work ?Need to increase intake of whole foods consisting mainly of vegetables and protein less carbohydrate drinking at least 64 ounces of water daily engaging in daily vigorous exercising at least 30 minutes discussed with patient.  Patient refused medications for weight loss management. ?

## 2022-02-02 ENCOUNTER — Other Ambulatory Visit: Payer: Self-pay

## 2022-02-03 ENCOUNTER — Encounter: Payer: Self-pay | Admitting: Nurse Practitioner

## 2022-02-03 ENCOUNTER — Other Ambulatory Visit: Payer: Self-pay

## 2022-02-03 MED ORDER — AMLODIPINE BESYLATE 5 MG PO TABS: 5.0000 mg | ORAL_TABLET | Freq: Every day | ORAL | 0 refills | Status: DC

## 2022-02-26 ENCOUNTER — Encounter: Payer: Self-pay | Admitting: Nurse Practitioner

## 2022-02-26 ENCOUNTER — Other Ambulatory Visit: Payer: Self-pay | Admitting: *Deleted

## 2022-02-26 MED ORDER — OMEPRAZOLE 20 MG PO CPDR: 20.0000 mg | DELAYED_RELEASE_CAPSULE | Freq: Every day | ORAL | 3 refills | Status: DC

## 2022-03-24 ENCOUNTER — Ambulatory Visit: Payer: 59 | Admitting: Nurse Practitioner

## 2022-03-24 ENCOUNTER — Encounter: Payer: Self-pay | Admitting: Nurse Practitioner

## 2022-03-24 VITALS — BP 132/87 | HR 76 | Ht 72.0 in | Wt 267.0 lb

## 2022-03-24 DIAGNOSIS — E782 Mixed hyperlipidemia: Secondary | ICD-10-CM | POA: Diagnosis not present

## 2022-03-24 DIAGNOSIS — I10 Essential (primary) hypertension: Secondary | ICD-10-CM

## 2022-03-24 DIAGNOSIS — F17200 Nicotine dependence, unspecified, uncomplicated: Secondary | ICD-10-CM | POA: Diagnosis not present

## 2022-03-24 NOTE — Progress Notes (Signed)
   Shane Pena     MRN: [id number]      DOB: March 25, 1981   HPI Shane Pena with past medical history of HTN, hyperlipidemia , GERD is  here for follow up for hyperlipidemia    Hyperlipidemia. Currently taking rosuvastatin 5 mg daily, patient denies muscle pain, adverse reactions to current medication.     ROS Denies recent fever or chills. Denies sinus pressure, nasal congestion, ear pain or sore throat. Denies chest congestion, productive cough or wheezing. Denies chest pains, palpitations and leg swelling Denies abdominal pain, nausea, vomiting,diarrhea or constipation.   Denies dysuria, frequency, hesitancy or incontinence. Denies joint pain, swelling and limitation in mobility. Denies headaches, seizures, numbness, or tingling. Denies depression, anxiety or insomnia.    PE  BP 132/87 (BP Location: Right Arm, Patient Position: Sitting, Cuff Size: Large)   Pulse 76   Ht 6' (1.829 m)   Wt 267 lb (121.1 kg)   SpO2 94%   BMI 36.21 kg/m   Patient alert and oriented and in no cardiopulmonary distress.  Chest: Clear to auscultation bilaterally.  CVS: S1, S2 no murmurs, no S3.Regular rate.  ABD: Soft non tender.   Ext: No edema  MS: Adequate ROM spine, shoulders, hips and knees.   Psych: Good eye contact, normal affect. Memory intact not anxious or depressed appearing.     Assessment & Plan  Hyperlipidemia The 10-year ASCVD risk score (Arnett DK, et al., 2019) is: 9.9%   Values used to calculate the score:     Age: 41 years     Sex: Male     Is Non-Hispanic African American: Yes     Diabetic: No     Tobacco smoker: Yes     Systolic Blood Pressure: 132 mmHg     Is BP treated: Yes     HDL Cholesterol: 40 mg/dL     Total Cholesterol: 203 mg/dL  Currently taking Crestor 5 mg daily Check lipid panel LDL goal is less than 100  Essential hypertension BP Readings from Last 3 Encounters:  03/24/22 132/87  01/20/22 138/90  12/03/21 (!) 142/102  Chronic  condition well-controlled on amlodipine 5 mg daily Continue current medications DASH diet advised Engage in regular daily exercises at least 150 minutes weekly   Current smoker Smokes about 1 pack of cigarette, lasting  3 to 4 days   Asked about quitting: confirms that he/she currently smokes cigarettes Advise to quit smoking: Educated about QUITTING to reduce the risk of cancer, cardio and cerebrovascular disease. Assess willingness: Unwilling to quit at this time, but is working on cutting back. Assist with counseling and pharmacotherapy: Counseled for 5 minutes and literature provided. Arrange for follow up: follow up in 6 months and continue to offer help.

## 2022-03-24 NOTE — Assessment & Plan Note (Signed)
BP Readings from Last 3 Encounters:  03/24/22 132/87  01/20/22 138/90  12/03/21 (!) 142/102  Chronic condition well-controlled on amlodipine 5 mg daily Continue current medications DASH diet advised Engage in regular daily exercises at least 150 minutes weekly

## 2022-03-26 ENCOUNTER — Encounter: Payer: Self-pay | Admitting: Nurse Practitioner

## 2022-04-15 ENCOUNTER — Other Ambulatory Visit: Payer: Self-pay | Admitting: Nurse Practitioner

## 2022-04-15 DIAGNOSIS — J3089 Other allergic rhinitis: Secondary | ICD-10-CM

## 2022-05-17 ENCOUNTER — Encounter: Payer: Self-pay | Admitting: Nurse Practitioner

## 2022-05-18 ENCOUNTER — Other Ambulatory Visit: Payer: Self-pay | Admitting: Family Medicine

## 2022-05-18 ENCOUNTER — Other Ambulatory Visit: Payer: Self-pay

## 2022-05-18 ENCOUNTER — Encounter: Payer: Self-pay | Admitting: Nurse Practitioner

## 2022-05-18 MED ORDER — AMLODIPINE BESYLATE 5 MG PO TABS: 5.0000 mg | ORAL_TABLET | Freq: Every day | ORAL | 0 refills | Status: DC

## 2022-05-18 NOTE — Telephone Encounter (Signed)
Please advise 

## 2022-05-18 NOTE — Telephone Encounter (Signed)
Called pt for clarity since he sent two message no answer left vm

## 2022-05-19 NOTE — Telephone Encounter (Signed)
Please encourage pt to increase his fluid intake and to inform us if his  BP is less then 90/60 (low) or greater than 140/90 (high). For low BP <90/60, hold off on BP meds to prevent bottoming out.

## 2022-05-21 NOTE — Telephone Encounter (Signed)
Spoke with pt scheduled appt for next available 8/28 with Dr Durwin Nora advised if it gets higher or worse to call for cancellation go to [facility] pt verbalized understanding

## 2022-05-25 ENCOUNTER — Ambulatory Visit (INDEPENDENT_AMBULATORY_CARE_PROVIDER_SITE_OTHER): Payer: Self-pay | Admitting: Internal Medicine

## 2022-05-25 ENCOUNTER — Encounter: Payer: Self-pay | Admitting: Internal Medicine

## 2022-05-25 VITALS — BP 145/88 | HR 85 | Ht 72.0 in | Wt 266.4 lb

## 2022-05-25 DIAGNOSIS — I10 Essential (primary) hypertension: Secondary | ICD-10-CM

## 2022-05-25 DIAGNOSIS — F17209 Nicotine dependence, unspecified, with unspecified nicotine-induced disorders: Secondary | ICD-10-CM

## 2022-05-25 MED ORDER — CHLORTHALIDONE 25 MG PO TABS: 25.0000 mg | ORAL_TABLET | Freq: Every day | ORAL | 2 refills | Status: DC

## 2022-05-25 NOTE — Assessment & Plan Note (Addendum)
BP 145/88 today.  These are consistent with the readings he has been having at home as well.  He is recently experienced dizziness and headaches within one hour of taking amlodipine.  The symptoms are not associated with a sudden change in position and he does not hypotensive when he has checked his blood pressure at home while experiencing the symptoms.  Unclear if his symptoms are related to amlodipine vs other etiology, however he would like to switch to a different antihypertensive medication today. -Stop amlodipine 5 mg daily -Start chlorthalidone 25 mg daily -Plan for follow-up in 4 weeks for BP check. -If he continues to experience similar symptoms despite stopping amlodipine, consider evaluation for OSA.  I did offer to order a sleep study for him today, but he declined.

## 2022-05-25 NOTE — Patient Instructions (Signed)
It was a pleasure to see you today.  Thank you for giving [place] the opportunity to be involved in your care.  Below is a brief recap of your visit and next steps.  We will plan to see you again in 4 weeks.  Summary We are switching to chlorthalidone 25 mg daily today and stopping amlodipine  Next steps Plan for follow up in 4 weeks to check your blood pressure and see if you are still having headaches.

## 2022-05-25 NOTE — Progress Notes (Signed)
Established Patient Office Visit  Subjective   Patient ID: Shane Pena, male    DOB: [birth year]  Age: 41 y.o. MRN: [id number]  Chief Complaint  Patient presents with   Hypertension    After starting meds noticed having dizziness and headaches. Took 30 day supply and just got it refilled and noticed the changes   Shane Pena is a 41 year old gentleman presenting today for an acute visit to discuss blood pressure concerns.  He has past medical history significant for essential hypertension and he is currently prescribed amlodipine 5 mg daily.  He has been on this medication for over a year and states that up until recently he tolerated the medication fine.  Lately however, he has noticed onset of dizziness and headaches within 30-40 minutes of taking the medication.  The dizziness and headaches are not associated with sudden changes in position.  He checks his blood pressure when he is experiencing the symptoms and reports systolic readings in the 518A.  The headaches are described as tension like a band across his forehead.  Shane Pena wants to know if he should stop taking medication for his blood pressure or if he should switch to a different agent given his recent symptoms.  Past Medical History:  Diagnosis Date   Allergy    GERD (gastroesophageal reflux disease)    Hyperlipidemia    Hypertension    Past Surgical History:  Procedure Laterality Date   ABDOMINAL SURGERY     Social History   Tobacco Use   Smoking status: Every Day    Packs/day: 0.25    Types: Cigarettes   Smokeless tobacco: Never  Vaping Use   Vaping Use: Never used  Substance Use Topics   Alcohol use: Yes    Comment: occassionally   Drug use: No   Family History  Problem Relation Age of Onset   Diabetes Mother    Kidney disease Mother    Hypertension Mother    Kidney disease Father    Hypertension Father    Colon cancer Neg Hx    Lung cancer Neg Hx    Prostate cancer Neg Hx    Allergies   Allergen Reactions   Other Itching and Rash   Pineapple     Itching    Review of Systems  Constitutional:  Negative for chills and fever.  HENT:  Negative for sore throat.   Respiratory:  Negative for cough and shortness of breath.   Cardiovascular:  Negative for chest pain, palpitations and leg swelling.  Gastrointestinal:  Negative for abdominal pain, blood in stool, constipation, diarrhea, nausea and vomiting.  Genitourinary:  Negative for dysuria and hematuria.  Musculoskeletal:  Negative for myalgias.  Skin:  Negative for itching and rash.  Neurological:  Positive for dizziness and headaches.  Psychiatric/Behavioral:  Negative for depression and suicidal ideas.      Objective:     BP (!) 145/88   Pulse 85   Ht 6' (1.829 m)   Wt 266 lb 6.4 oz (120.8 kg)   SpO2 97%   BMI 36.13 kg/m  BP Readings from Last 3 Encounters:  05/25/22 (!) 145/88  03/24/22 132/87  01/20/22 138/90   Physical Exam Vitals reviewed.  Constitutional:      General: He is not in acute distress.    Appearance: Normal appearance. He is obese. He is not ill-appearing.  HENT:     Head: Normocephalic and atraumatic.     Nose: Nose normal. No congestion or rhinorrhea.  Mouth/Throat:     Mouth: Mucous membranes are moist.     Pharynx: Oropharynx is clear.  Eyes:     Extraocular Movements: Extraocular movements intact.     Conjunctiva/sclera: Conjunctivae normal.     Pupils: Pupils are equal, round, and reactive to light.  Cardiovascular:     Rate and Rhythm: Normal rate and regular rhythm.     Pulses: Normal pulses.     Heart sounds: Normal heart sounds. No murmur heard. Pulmonary:     Effort: Pulmonary effort is normal.     Breath sounds: Normal breath sounds. No wheezing, rhonchi or rales.  Abdominal:     General: Abdomen is flat. Bowel sounds are normal. There is no distension.     Palpations: Abdomen is soft.     Tenderness: There is no abdominal tenderness.  Musculoskeletal:         General: No swelling or deformity. Normal range of motion.     Cervical back: Normal range of motion.  Skin:    General: Skin is warm and dry.     Capillary Refill: Capillary refill takes less than 2 seconds.  Neurological:     General: No focal deficit present.     Mental Status: He is alert and oriented to person, place, and time.     Cranial Nerves: No cranial nerve deficit.     Sensory: No sensory deficit.     Motor: No weakness.     Coordination: Coordination normal.  Psychiatric:        Mood and Affect: Mood normal.        Behavior: Behavior normal.        Thought Content: Thought content normal.    No results found for any visits on 05/25/22.  Last CBC Lab Results  Component Value Date   WBC 8.1 01/13/2022   HGB 15.8 01/13/2022   HCT 46.1 01/13/2022   MCV 88 01/13/2022   MCH 30.0 01/13/2022   RDW 13.1 01/13/2022   PLT 352 85/88/5027   Last metabolic panel Lab Results  Component Value Date   GLUCOSE 101 (H) 01/13/2022   NA 140 01/13/2022   K 4.4 01/13/2022   CL 103 01/13/2022   CO2 23 01/13/2022   BUN 10 01/13/2022   CREATININE 1.19 01/13/2022   EGFR 79 01/13/2022   CALCIUM 9.8 01/13/2022   PROT 7.1 01/13/2022   ALBUMIN 4.7 01/13/2022   LABGLOB 2.4 01/13/2022   AGRATIO 2.0 01/13/2022   BILITOT 0.4 01/13/2022   ALKPHOS 83 01/13/2022   AST 23 01/13/2022   ALT 18 01/13/2022   Last lipids Lab Results  Component Value Date   CHOL 203 (H) 01/13/2022   HDL 40 01/13/2022   LDLCALC 133 (H) 01/13/2022   TRIG 167 (H) 01/13/2022   CHOLHDL 5.1 (H) 01/13/2022   Last hemoglobin A1c Lab Results  Component Value Date   HGBA1C 5.9 (H) 01/13/2022   Last thyroid functions Lab Results  Component Value Date   TSH 0.811 01/13/2022   Last vitamin D Lab Results  Component Value Date   VD25OH 31.3 01/13/2022   The 10-year ASCVD risk score (Arnett DK, et al., 2019) is: 11.8%    Assessment & Plan:   Problem List Items Addressed This Visit        Cardiovascular and Mediastinum   Essential hypertension - Primary    BP 145/88 today.  These are consistent with the readings he has been having at home as well.  He is recently experienced  dizziness and headaches within one hour of taking amlodipine.  The symptoms are not associated with a sudden change in position and he does not hypotensive when he has checked his blood pressure at home while experiencing the symptoms.  Unclear if his symptoms are related to amlodipine vs other etiology, however he would like to switch to a different antihypertensive medication today. -Stop amlodipine 5 mg daily -Start chlorthalidone 25 mg daily -Plan for follow-up in 4 weeks for BP check. -If he continues to experience similar symptoms despite stopping amlodipine, consider evaluation for OSA.  I did offer to order a sleep study for him today, but he declined.      Relevant Medications   chlorthalidone (HYGROTON) 25 MG tablet     Other   Tobacco use disorder, continuous    He continues to smoke cigarettes, but is making progress toward cessation.  A pack of cigarettes is currently lasting him 3 days, whereas he was previously smoking 1 pack/day.  I congratulated him on his progress and encouraged him to continue striving for complete cessation.       Return in about 4 weeks (around 06/22/2022).    Johnette Abraham, MD

## 2022-05-25 NOTE — Assessment & Plan Note (Signed)
He continues to smoke cigarettes, but is making progress toward cessation.  A pack of cigarettes is currently lasting him 3 days, whereas he was previously smoking 1 pack/day.  I congratulated him on his progress and encouraged him to continue striving for complete cessation.

## 2022-06-22 ENCOUNTER — Ambulatory Visit: Payer: Self-pay | Admitting: Internal Medicine

## 2022-06-26 ENCOUNTER — Ambulatory Visit: Payer: Self-pay | Admitting: Internal Medicine

## 2022-06-26 ENCOUNTER — Encounter: Payer: Self-pay | Admitting: Internal Medicine

## 2022-06-29 ENCOUNTER — Encounter: Payer: Self-pay | Admitting: Internal Medicine

## 2022-06-29 ENCOUNTER — Ambulatory Visit: Payer: Self-pay | Admitting: Internal Medicine

## 2022-07-08 ENCOUNTER — Ambulatory Visit: Payer: Self-pay | Admitting: Internal Medicine

## 2022-09-23 ENCOUNTER — Ambulatory Visit: Payer: 59 | Admitting: Nurse Practitioner

## 2022-09-24 ENCOUNTER — Ambulatory Visit: Payer: BC Managed Care – PPO | Admitting: Internal Medicine

## 2022-09-24 ENCOUNTER — Encounter: Payer: Self-pay | Admitting: Internal Medicine

## 2022-09-24 VITALS — BP 173/85 | HR 86 | Resp 16 | Ht 72.0 in | Wt 261.6 lb

## 2022-09-24 DIAGNOSIS — K219 Gastro-esophageal reflux disease without esophagitis: Secondary | ICD-10-CM

## 2022-09-24 DIAGNOSIS — I10 Essential (primary) hypertension: Secondary | ICD-10-CM | POA: Diagnosis not present

## 2022-09-24 DIAGNOSIS — E782 Mixed hyperlipidemia: Secondary | ICD-10-CM

## 2022-09-24 MED ORDER — HYDROCHLOROTHIAZIDE 12.5 MG PO TABS: 12.5000 mg | ORAL_TABLET | Freq: Every day | ORAL | 2 refills | Status: DC

## 2022-09-24 NOTE — Patient Instructions (Signed)
It was a pleasure to see you today.  Thank you for giving [place] the opportunity to be involved in your care.  Below is a brief recap of your visit and next steps.  We will plan to see you again in 4 weeks.  Summary Start HCTZ 12.5 mg daily for treatment of hypertension Repeat cholesterol panel today Follow up in 4 weeks

## 2022-09-24 NOTE — Progress Notes (Unsigned)
Established Patient Office Visit  Subjective   Patient ID: DEAGLAN LILE, male    DOB: 11-25-1980  Age: 41 y.o. MRN: [id number]  Chief Complaint  Patient presents with   Hypertension    6 month follow up- hadn't been taking bp med. Made him have headache and lightheaded    HPI  Past Medical History:  Diagnosis Date   Allergy    GERD (gastroesophageal reflux disease)    Hyperlipidemia    Hypertension    Past Surgical History:  Procedure Laterality Date   ABDOMINAL SURGERY     Social History   Tobacco Use   Smoking status: Every Day    Packs/day: 0.25    Types: Cigarettes   Smokeless tobacco: Never  Vaping Use   Vaping Use: Never used  Substance Use Topics   Alcohol use: Yes    Comment: occassionally   Drug use: No   Family History  Problem Relation Age of Onset   Diabetes Mother    Kidney disease Mother    Hypertension Mother    Kidney disease Father    Hypertension Father    Colon cancer Neg Hx    Lung cancer Neg Hx    Prostate cancer Neg Hx    Allergies  Allergen Reactions   Other Itching and Rash   Pineapple     Itching       ROS    Objective:     BP (!) 173/85   Pulse 86   Resp 16   Ht 6' (1.829 m)   Wt 261 lb 9.6 oz (118.7 kg)   SpO2 96%   BMI 35.48 kg/m  BP Readings from Last 3 Encounters:  09/24/22 (!) 173/85  05/25/22 (!) 145/88  03/24/22 132/87      Physical Exam  Last CBC Lab Results  Component Value Date   WBC 8.1 01/13/2022   HGB 15.8 01/13/2022   HCT 46.1 01/13/2022   MCV 88 01/13/2022   MCH 30.0 01/13/2022   RDW 13.1 01/13/2022   PLT 352 93/73/4287   Last metabolic panel Lab Results  Component Value Date   GLUCOSE 101 (H) 01/13/2022   NA 140 01/13/2022   K 4.4 01/13/2022   CL 103 01/13/2022   CO2 23 01/13/2022   BUN 10 01/13/2022   CREATININE 1.19 01/13/2022   EGFR 79 01/13/2022   CALCIUM 9.8 01/13/2022   PROT 7.1 01/13/2022   ALBUMIN 4.7 01/13/2022   LABGLOB 2.4 01/13/2022   AGRATIO 2.0  01/13/2022   BILITOT 0.4 01/13/2022   ALKPHOS 83 01/13/2022   AST 23 01/13/2022   ALT 18 01/13/2022   Last lipids Lab Results  Component Value Date   CHOL 229 (H) 09/24/2022   HDL 41 09/24/2022   LDLCALC 159 (H) 09/24/2022   TRIG 160 (H) 09/24/2022   CHOLHDL 5.6 (H) 09/24/2022   Last hemoglobin A1c Lab Results  Component Value Date   HGBA1C 5.9 (H) 01/13/2022   Last thyroid functions Lab Results  Component Value Date   TSH 0.811 01/13/2022   Last vitamin D Lab Results  Component Value Date   VD25OH 31.3 01/13/2022   The 10-year ASCVD risk score (Arnett DK, et al., 2019) is: 17.5%    Assessment & Plan:   Problem List Items Addressed This Visit       Cardiovascular and Mediastinum   Hypertension - Primary   Relevant Medications   hydrochlorothiazide (HYDRODIURIL) 12.5 MG tablet     Other   Hyperlipidemia  Relevant Medications   hydrochlorothiazide (HYDRODIURIL) 12.5 MG tablet   Other Relevant Orders   Lipid Profile (Completed)    Return in about 4 weeks (around 10/22/2022) for HTN, HLD.    Johnette Abraham, MD

## 2022-09-25 LAB — LIPID PANEL
Chol/HDL Ratio: 5.6 ratio — ABNORMAL HIGH (ref 0.0–5.0)
Cholesterol, Total: 229 mg/dL — ABNORMAL HIGH (ref 100–199)
HDL: 41 mg/dL (ref >39)
LDL Chol Calc (NIH): 159 mg/dL — ABNORMAL HIGH (ref 0–99)
Triglycerides: 160 mg/dL — ABNORMAL HIGH (ref 0–149)
VLDL Cholesterol Cal: 29 mg/dL (ref 5–40)

## 2022-09-30 NOTE — Assessment & Plan Note (Signed)
His initial blood pressure today was 173/85, improved to 130/86 on repeat.  He has previously been prescribed amlodipine and chlorthalidone but stopped taking the medications due to side effects, namely dizziness. -Through shared decision making, we will start HCTZ 12.5 mg daily for HTN, goal BP is < 130/80 -Follow-up in 4 weeks for HTN check

## 2022-09-30 NOTE — Assessment & Plan Note (Signed)
Previously prescribed rosuvastatin 5 mg daily for treatment of hyperlipidemia.  His last lipid panel was updated in April.  Total cholesterol 203, LDL 133.  He is not currently taking the medication as he states that he was told by a provider who did his work physical that he did not need the medication. -Repeat lipid panel ordered today.  We will review results and discuss whether or not he should resume rosuvastatin.

## 2022-09-30 NOTE — Assessment & Plan Note (Signed)
Symptoms well-controlled with omeprazole 20 mg daily.  No changes today.

## 2022-10-22 ENCOUNTER — Ambulatory Visit: Payer: BC Managed Care – PPO | Admitting: Internal Medicine

## 2022-10-22 ENCOUNTER — Encounter: Payer: Self-pay | Admitting: Internal Medicine

## 2022-12-25 ENCOUNTER — Other Ambulatory Visit: Payer: Self-pay | Admitting: Internal Medicine

## 2022-12-25 DIAGNOSIS — I10 Essential (primary) hypertension: Secondary | ICD-10-CM

## 2022-12-27 ENCOUNTER — Other Ambulatory Visit: Payer: Self-pay | Admitting: Internal Medicine

## 2022-12-27 DIAGNOSIS — I10 Essential (primary) hypertension: Secondary | ICD-10-CM

## 2022-12-28 ENCOUNTER — Other Ambulatory Visit: Payer: Self-pay | Admitting: Internal Medicine

## 2022-12-28 DIAGNOSIS — I10 Essential (primary) hypertension: Secondary | ICD-10-CM

## 2023-03-17 ENCOUNTER — Encounter: Payer: Self-pay | Admitting: Adult Health

## 2023-03-17 ENCOUNTER — Ambulatory Visit (INDEPENDENT_AMBULATORY_CARE_PROVIDER_SITE_OTHER): Payer: 59 | Admitting: Adult Health

## 2023-03-17 VITALS — BP 135/78 | HR 92 | Temp 97.6°F | Resp 18 | Ht 72.0 in | Wt 266.0 lb

## 2023-03-17 DIAGNOSIS — Z113 Encounter for screening for infections with a predominantly sexual mode of transmission: Secondary | ICD-10-CM

## 2023-03-17 DIAGNOSIS — M62838 Other muscle spasm: Secondary | ICD-10-CM | POA: Diagnosis not present

## 2023-03-17 DIAGNOSIS — R7303 Prediabetes: Secondary | ICD-10-CM

## 2023-03-17 DIAGNOSIS — Z7689 Persons encountering health services in other specified circumstances: Secondary | ICD-10-CM | POA: Diagnosis not present

## 2023-03-17 DIAGNOSIS — J3089 Other allergic rhinitis: Secondary | ICD-10-CM

## 2023-03-17 DIAGNOSIS — I10 Essential (primary) hypertension: Secondary | ICD-10-CM

## 2023-03-17 DIAGNOSIS — E782 Mixed hyperlipidemia: Secondary | ICD-10-CM

## 2023-03-17 DIAGNOSIS — E669 Obesity, unspecified: Secondary | ICD-10-CM

## 2023-03-17 DIAGNOSIS — K219 Gastro-esophageal reflux disease without esophagitis: Secondary | ICD-10-CM

## 2023-03-17 MED ORDER — LEVOCETIRIZINE DIHYDROCHLORIDE 5 MG PO TABS
5.0000 mg | ORAL_TABLET | Freq: Every evening | ORAL | 0 refills | Status: DC
Start: 2023-03-17 — End: 2023-05-10

## 2023-03-17 MED ORDER — FLUTICASONE PROPIONATE 50 MCG/ACT NA SUSP
1.0000 | Freq: Every day | NASAL | 2 refills | Status: DC
Start: 2023-03-17 — End: 2024-08-01

## 2023-03-17 MED ORDER — CYCLOBENZAPRINE HCL 10 MG PO TABS
10.0000 mg | ORAL_TABLET | Freq: Every evening | ORAL | 0 refills | Status: DC | PRN
Start: 2023-03-17 — End: 2023-04-04

## 2023-03-17 NOTE — Progress Notes (Signed)
-   CBC  normal, not anemic -  CMP normal, electrolytes, and liver enzymes all normal

## 2023-03-17 NOTE — Progress Notes (Signed)
[facility] clinic  Provider:  Kenard Gower DNP  Code Status:  Full Code Goals of Care:     03/17/2023    9:02 AM  Advanced Directives  Does Patient Have a Medical Advance Directive? No  Would patient like information on creating a medical advance directive? No - Patient declined     Chief Complaint  Patient presents with   Establish Care    New Patient to establish care    HPI: Patient is a 42 y.o. male seen today to establish care with [facility]. He is single and lives with his girlfriend. 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 He smokes a a pack or less in 2-3 days and 2-3 beers/week. His father is 26 years old and his mother died at 55 years old due to COVID-55 four years old. He has 2 sisters and has no children. He exercise 3X/week at [employer] for 2 hours. He does not eat pork but eats [place] and chicken. He checks his vision yearly since he works for [employer].  Past Medical History:  Diagnosis Date   Allergy    GERD (gastroesophageal reflux disease)    Hyperlipidemia    Hypertension     Past Surgical History:  Procedure Laterality Date   ABDOMINAL SURGERY      Allergies  Allergen Reactions   Other Itching and Rash   Pineapple     Itching     Outpatient Encounter Medications as of 03/17/2023  Medication Sig   omeprazole (PRILOSEC) 20 MG capsule Take 1 capsule (20 mg total) by mouth daily.   rosuvastatin (CRESTOR) 5 MG tablet Take 1 tablet (5 mg total) by mouth daily.   [DISCONTINUED] cyclobenzaprine (FLEXERIL) 10 MG tablet Take 1 tablet (10 mg total) by mouth at bedtime as needed for muscle spasms.   [DISCONTINUED] fluticasone (FLONASE) 50 MCG/ACT nasal spray Place 1 spray into both nostrils daily.   [DISCONTINUED] levocetirizine (XYZAL) 5 MG tablet TAKE 1 TABLET BY MOUTH ONCE DAILY IN THE EVENING   cyclobenzaprine (FLEXERIL) 10 MG tablet Take 1 tablet (10 mg total) by mouth at bedtime as needed for muscle spasms.   fluticasone (FLONASE) 50  MCG/ACT nasal spray Place 1 spray into both nostrils daily.   hydrochlorothiazide (HYDRODIURIL) 12.5 MG tablet Take 1 tablet (12.5 mg total) by mouth daily.   levocetirizine (XYZAL) 5 MG tablet Take 1 tablet (5 mg total) by mouth every evening.   No facility-administered encounter medications on file as of 03/17/2023.    Review of Systems:  Review of Systems  Constitutional:  Negative for activity change, appetite change and fever.  HENT:  Negative for sore throat.   Eyes: Negative.   Cardiovascular:  Negative for chest pain and leg swelling.  Gastrointestinal:  Negative for abdominal distention, diarrhea and vomiting.  Genitourinary:  Negative for dysuria, frequency and urgency.  Skin:  Negative for color change.  Neurological:  Negative for dizziness and headaches.  Psychiatric/Behavioral:  Negative for behavioral problems and sleep disturbance. The patient is not nervous/anxious.     Health Maintenance  Topic Date Due   INFLUENZA VACCINE  04/29/2023   DTaP/Tdap/Td (2 - Td or Tdap) 01/21/2032   Hepatitis C Screening  Completed   HIV Screening  Completed   HPV VACCINES  Aged Out   COVID-19 Vaccine  Discontinued    Physical Exam: Vitals:   03/17/23 0858  BP: 135/78  Pulse: 92  Resp: 18  Temp: 97.6 F (36.4 C)  SpO2: 97%  Weight: 266 lb (120.7 kg)  Height: 6' (1.829 m)   Body mass index is 36.08 kg/m. Physical Exam Constitutional:      General: He is not in acute distress.    Appearance: He is obese.  HENT:     Head: Normocephalic and atraumatic.     Mouth/Throat:     Mouth: Mucous membranes are moist.  Eyes:     Conjunctiva/sclera: Conjunctivae normal.  Cardiovascular:     Rate and Rhythm: Normal rate and regular rhythm.     Pulses: Normal pulses.     Heart sounds: Normal heart sounds.  Pulmonary:     Effort: Pulmonary effort is normal.     Breath sounds: Normal breath sounds.  Abdominal:     General: Bowel sounds are normal.     Palpations: Abdomen is  soft.  Musculoskeletal:        General: No swelling. Normal range of motion.     Cervical back: Normal range of motion.  Skin:    General: Skin is warm and dry.  Neurological:     General: No focal deficit present.     Mental Status: He is alert and oriented to person, place, and time.  Psychiatric:        Mood and Affect: Mood normal.        Behavior: Behavior normal.        Thought Content: Thought content normal.        Judgment: Judgment normal.     Labs reviewed: Basic Metabolic Panel: No results for input(s): "NA", "K", "CL", "CO2", "GLUCOSE", "BUN", "CREATININE", "CALCIUM", "MG", "PHOS", "TSH" in the last 8760 hours. Liver Function Tests: No results for input(s): "AST", "ALT", "ALKPHOS", "BILITOT", "PROT", "ALBUMIN" in the last 8760 hours. No results for input(s): "LIPASE", "AMYLASE" in the last 8760 hours. No results for input(s): "AMMONIA" in the last 8760 hours. CBC: No results for input(s): "WBC", "NEUTROABS", "HGB", "HCT", "MCV", "PLT" in the last 8760 hours. Lipid Panel: Recent Labs    09/24/22 1125  CHOL 229*  HDL 41  LDLCALC 159*  TRIG 160*  CHOLHDL 5.6*   Lab Results  Component Value Date   HGBA1C 5.9 (H) 01/13/2022    Procedures since last visit: No results found.  Assessment/Plan  1. Encounter to establish care -  established care with [facility]  2. Muscle spasm -  he has occasional lower back pains - cyclobenzaprine (FLEXERIL) 10 MG tablet; Take 1 tablet (10 mg total) by mouth at bedtime as needed for muscle spasms.  Dispense: 30 tablet; Refill: 0  3. Environmental and seasonal allergies - fluticasone (FLONASE) 50 MCG/ACT nasal spray; Place 1 spray into both nostrils daily.  Dispense: 16 g; Refill: 2 - levocetirizine (XYZAL) 5 MG tablet; Take 1 tablet (5 mg total) by mouth every evening.  Dispense: 90 tablet; Refill: 0  4. Prediabetes -  diet-controlled - CBC With Differential/Platelet - Hemoglobin A1C  5. Screen for STD (sexually transmitted  disease) - Hep C Antibody  6. Essential hypertension -  BP 135/78, stable -  continue HCTZ - Complete Metabolic Panel with eGFR  7. Mixed hyperlipidemia -  continue Rosuvastatin  8. Gastroesophageal reflux disease without esophagitis -  continue Omeprazole  9. Obesity (BMI 30-39.9) Body mass index is 36.08 kg/m. -  counseled regarding diet and exercise     Labs/tests ordered:  CBC, CMP, A1C and hep C antibody  Next appt:  Visit date not found

## 2023-03-17 NOTE — Progress Notes (Signed)
-    no anemia, CBC all normal -  electrolytes and and liver enzymes all normal

## 2023-03-17 NOTE — Patient Instructions (Signed)

## 2023-03-18 LAB — COMPLETE METABOLIC PANEL WITH GFR
AG Ratio: 1.6 (calc) (ref 1.0–2.5)
ALT: 18 U/L (ref 9–46)
AST: 23 U/L (ref 10–40)
Albumin: 4.4 g/dL (ref 3.6–5.1)
Alkaline phosphatase (APISO): 61 U/L (ref 36–130)
BUN: 13 mg/dL (ref 7–25)
CO2: 29 mmol/L (ref 20–32)
Calcium: 9.8 mg/dL (ref 8.6–10.3)
Chloride: 103 mmol/L (ref 98–110)
Creat: 1.25 mg/dL (ref 0.60–1.29)
Globulin: 2.7 g/dL (calc) (ref 1.9–3.7)
Glucose, Bld: 92 mg/dL (ref 65–99)
Potassium: 4.1 mmol/L (ref 3.5–5.3)
Sodium: 138 mmol/L (ref 135–146)
Total Bilirubin: 0.5 mg/dL (ref 0.2–1.2)
Total Protein: 7.1 g/dL (ref 6.1–8.1)
eGFR: 74 mL/min/{1.73_m2} (ref >=60)

## 2023-03-18 LAB — HEMOGLOBIN A1C
Hgb A1c MFr Bld: 5.9 % of total Hgb — ABNORMAL HIGH (ref <5.7)
Mean Plasma Glucose: 123 mg/dL
eAG (mmol/L): 6.8 mmol/L

## 2023-03-18 LAB — CBC WITH DIFFERENTIAL/PLATELET
Absolute Monocytes: 352 cells/uL (ref 200–950)
Basophils Absolute: 51 cells/uL (ref 0–200)
Basophils Relative: 0.8 %
Eosinophils Absolute: 102 cells/uL (ref 15–500)
Eosinophils Relative: 1.6 %
HCT: 45.2 % (ref 38.5–50.0)
Hemoglobin: 15.2 g/dL (ref 13.2–17.1)
Lymphs Abs: 2477 cells/uL (ref 850–3900)
MCH: 29.6 pg (ref 27.0–33.0)
MCHC: 33.6 g/dL (ref 32.0–36.0)
MCV: 88.1 fL (ref 80.0–100.0)
MPV: 9.5 fL (ref 7.5–12.5)
Monocytes Relative: 5.5 %
Neutro Abs: 3418 cells/uL (ref 1500–7800)
Neutrophils Relative %: 53.4 %
Platelets: 368 10*3/uL (ref 140–400)
RBC: 5.13 10*6/uL (ref 4.20–5.80)
RDW: 12.8 % (ref 11.0–15.0)
Total Lymphocyte: 38.7 %
WBC: 6.4 10*3/uL (ref 3.8–10.8)

## 2023-03-18 LAB — HEPATITIS C ANTIBODY: Hepatitis C Ab: NONREACTIVE

## 2023-03-25 ENCOUNTER — Ambulatory Visit: Payer: 59 | Admitting: Orthopedic Surgery

## 2023-03-25 ENCOUNTER — Other Ambulatory Visit: Payer: Self-pay | Admitting: Nurse Practitioner

## 2023-03-25 DIAGNOSIS — E782 Mixed hyperlipidemia: Secondary | ICD-10-CM

## 2023-04-04 ENCOUNTER — Other Ambulatory Visit: Payer: Self-pay | Admitting: Internal Medicine

## 2023-04-04 ENCOUNTER — Other Ambulatory Visit: Payer: Self-pay | Admitting: Adult Health

## 2023-04-04 DIAGNOSIS — I10 Essential (primary) hypertension: Secondary | ICD-10-CM

## 2023-04-04 DIAGNOSIS — J3089 Other allergic rhinitis: Secondary | ICD-10-CM

## 2023-04-04 DIAGNOSIS — M62838 Other muscle spasm: Secondary | ICD-10-CM

## 2023-04-05 ENCOUNTER — Encounter: Payer: Self-pay | Admitting: Adult Health

## 2023-04-05 DIAGNOSIS — I10 Essential (primary) hypertension: Secondary | ICD-10-CM

## 2023-04-05 DIAGNOSIS — E782 Mixed hyperlipidemia: Secondary | ICD-10-CM

## 2023-04-05 MED ORDER — ROSUVASTATIN CALCIUM 5 MG PO TABS
5.0000 mg | ORAL_TABLET | Freq: Every day | ORAL | 0 refills | Status: DC
Start: 2023-04-05 — End: 2023-07-02

## 2023-04-05 MED ORDER — OMEPRAZOLE 20 MG PO CPDR: 20.0000 mg | DELAYED_RELEASE_CAPSULE | Freq: Every day | ORAL | 0 refills | Status: DC

## 2023-04-05 MED ORDER — HYDROCHLOROTHIAZIDE 12.5 MG PO TABS
12.5000 mg | ORAL_TABLET | Freq: Every day | ORAL | 0 refills | Status: DC
Start: 2023-04-05 — End: 2023-05-03

## 2023-04-05 NOTE — Telephone Encounter (Signed)
Was given Flexeril 60 tabs and not due for refill since he takes it only at bedtime.

## 2023-04-06 MED ORDER — CYCLOBENZAPRINE HCL 10 MG PO TABS
10.00 mg | ORAL_TABLET | Freq: Every evening | ORAL | 0 refills | Status: AC | PRN
Start: 2023-04-06 — End: ?

## 2023-04-22 ENCOUNTER — Ambulatory Visit (INDEPENDENT_AMBULATORY_CARE_PROVIDER_SITE_OTHER): Payer: Managed Care, Other (non HMO) | Admitting: Adult Health

## 2023-04-22 ENCOUNTER — Encounter: Payer: Self-pay | Admitting: Adult Health

## 2023-04-22 VITALS — BP 121/88 | HR 86 | Temp 97.3°F | Resp 18 | Ht 72.0 in | Wt 271.4 lb

## 2023-04-22 DIAGNOSIS — R7303 Prediabetes: Secondary | ICD-10-CM | POA: Diagnosis not present

## 2023-04-22 DIAGNOSIS — K219 Gastro-esophageal reflux disease without esophagitis: Secondary | ICD-10-CM | POA: Diagnosis not present

## 2023-04-22 DIAGNOSIS — G8929 Other chronic pain: Secondary | ICD-10-CM

## 2023-04-22 DIAGNOSIS — E669 Obesity, unspecified: Secondary | ICD-10-CM

## 2023-04-22 DIAGNOSIS — E782 Mixed hyperlipidemia: Secondary | ICD-10-CM

## 2023-04-22 DIAGNOSIS — F17209 Nicotine dependence, unspecified, with unspecified nicotine-induced disorders: Secondary | ICD-10-CM

## 2023-04-22 DIAGNOSIS — I10 Essential (primary) hypertension: Secondary | ICD-10-CM | POA: Diagnosis not present

## 2023-04-22 DIAGNOSIS — M545 Low back pain, unspecified: Secondary | ICD-10-CM | POA: Insufficient documentation

## 2023-04-22 NOTE — Progress Notes (Signed)
[facility] clinic  Provider:  Kenard Gower DNP  Code Status:  Full Code  Goals of Care:     04/22/2023    8:44 AM  Advanced Directives  Does Patient Have a Medical Advance Directive? No  Would patient like information on creating a medical advance directive? No - Patient declined     Chief Complaint  Patient presents with   Follow-up    One month follow-up    HPI: Patient is a 42 y.o. male seen today for a 26-month follow up of chronic issues.  Primary hypertension -  BP 121/88, takes HCTZ  Mixed hyperlipidemia -  cholesterol 229, triglycerides 160, LDL 159 (09/24/22), takes Rosuvastatin  Prediabetes -  A1c 5.9, not on medication  Gastroesophageal reflux disease without esophagitis -  no heartburn, takes Omeprazole  Chronic low back pain -   takes Flexeril PRN  Wt Readings from Last 3 Encounters:  04/22/23 271 lb 6.4 oz (123.1 kg)  03/17/23 266 lb (120.7 kg)  09/24/22 261 lb 9.6 oz (118.7 kg)     Past Medical History:  Diagnosis Date   Allergy    GERD (gastroesophageal reflux disease)    Hyperlipidemia    Hypertension     Past Surgical History:  Procedure Laterality Date   ABDOMINAL SURGERY      Allergies  Allergen Reactions   Other Itching and Rash   Pineapple     Itching     Outpatient Encounter Medications as of 04/22/2023  Medication Sig   cyclobenzaprine (FLEXERIL) 10 MG tablet Take 1 tablet (10 mg total) by mouth at bedtime as needed for muscle spasms.   fluticasone (FLONASE) 50 MCG/ACT nasal spray Place 1 spray into both nostrils daily.   hydrochlorothiazide (HYDRODIURIL) 12.5 MG tablet Take 1 tablet (12.5 mg total) by mouth daily.   levocetirizine (XYZAL) 5 MG tablet Take 1 tablet (5 mg total) by mouth every evening.   omeprazole (PRILOSEC) 20 MG capsule Take 1 capsule (20 mg total) by mouth daily.   rosuvastatin (CRESTOR) 5 MG tablet Take 1 tablet (5 mg total) by mouth daily.   No facility-administered encounter medications on file as of  04/22/2023.    Review of Systems:  Review of Systems  Constitutional:  Negative for activity change, appetite change and fever.  HENT:  Negative for sore throat.   Eyes: Negative.   Cardiovascular:  Negative for chest pain and leg swelling.  Gastrointestinal:  Negative for abdominal distention, diarrhea and vomiting.  Genitourinary:  Negative for dysuria, frequency and urgency.  Skin:  Negative for color change.  Neurological:  Negative for dizziness and headaches.  Psychiatric/Behavioral:  Negative for behavioral problems and sleep disturbance. The patient is not nervous/anxious.     Health Maintenance  Topic Date Due   INFLUENZA VACCINE  04/29/2023   DTaP/Tdap/Td (2 - Td or Tdap) 01/21/2032   Hepatitis C Screening  Completed   HIV Screening  Completed   HPV VACCINES  Aged Out   COVID-19 Vaccine  Discontinued    Physical Exam: Vitals:   04/22/23 0843  BP: 121/88  Pulse: 86  Resp: 18  Temp: (!) 97.3 F (36.3 C)  SpO2: 99%  Weight: 271 lb 6.4 oz (123.1 kg)  Height: 6' (1.829 m)   Body mass index is 36.81 kg/m. Physical Exam Constitutional:      General: He is not in acute distress.    Appearance: He is obese.  HENT:     Head: Normocephalic and atraumatic.  Mouth/Throat:     Mouth: Mucous membranes are moist.  Eyes:     Conjunctiva/sclera: Conjunctivae normal.  Cardiovascular:     Rate and Rhythm: Normal rate and regular rhythm.     Pulses: Normal pulses.     Heart sounds: Normal heart sounds.  Pulmonary:     Effort: Pulmonary effort is normal.     Breath sounds: Normal breath sounds.  Abdominal:     General: Bowel sounds are normal.     Palpations: Abdomen is soft.  Musculoskeletal:        General: No swelling. Normal range of motion.     Cervical back: Normal range of motion.  Skin:    General: Skin is warm and dry.  Neurological:     General: No focal deficit present.     Mental Status: He is alert and oriented to person, place, and time.   Psychiatric:        Mood and Affect: Mood normal.        Behavior: Behavior normal.        Thought Content: Thought content normal.        Judgment: Judgment normal.     Labs reviewed: Basic Metabolic Panel: Recent Labs    03/17/23 0946  NA 138  K 4.1  CL 103  CO2 29  GLUCOSE 92  BUN 13  CREATININE 1.25  CALCIUM 9.8   Liver Function Tests: Recent Labs    03/17/23 0946  AST 23  ALT 18  BILITOT 0.5  PROT 7.1   No results for input(s): "LIPASE", "AMYLASE" in the last 8760 hours. No results for input(s): "AMMONIA" in the last 8760 hours. CBC: Recent Labs    03/17/23 0946  WBC 6.4  NEUTROABS 3,418  HGB 15.2  HCT 45.2  MCV 88.1  PLT 368   Lipid Panel: Recent Labs    09/24/22 1125  CHOL 229*  HDL 41  LDLCALC 159*  TRIG 160*  CHOLHDL 5.6*   Lab Results  Component Value Date   HGBA1C 5.9 (H) 03/17/2023    Procedures since last visit: No results found.  Assessment/Plan  1. Primary hypertension -  BP stable -  continue Hydrochlorothiazide  2. Mixed hyperlipidemia Lab Results  Component Value Date   CHOL 229 (H) 09/24/2022   HDL 41 09/24/2022   LDLCALC 159 (H) 09/24/2022   TRIG 160 (H) 09/24/2022   CHOLHDL 5.6 (H) 09/24/2022    -  exercises at [employer] for an hour 3-4X/week, walks 3 miles/day -  continue Rosuvastatin  3. Prediabetes Lab Results  Component Value Date   HGBA1C 5.9 (H) 03/17/2023    -  will cut down on sweets and carbs -  does regular exercise -  diet-controlled  4. Gastroesophageal reflux disease without esophagitis -  stable -  continue Omeprazole  5. Chronic midline low back pain without sciatica -  stable -  continue Flexeril PRN  6. Tobacco use disorder, continuous -  has cut down on tobacco use from 7 sticks/day to 3 sticks/day -  plans to cut more  7. Obesity (BMI 30-39.9) Body mass index is 36.81 kg/m. -  continue exercising regularly and cutting on sweets and carbs     Labs/tests ordered:   lipid panel next visit   Next appt:  Visit date not found

## 2023-05-03 ENCOUNTER — Other Ambulatory Visit: Payer: Self-pay | Admitting: Adult Health

## 2023-05-03 DIAGNOSIS — I10 Essential (primary) hypertension: Secondary | ICD-10-CM

## 2023-05-10 ENCOUNTER — Encounter: Payer: Self-pay | Admitting: Adult Health

## 2023-05-10 DIAGNOSIS — I10 Essential (primary) hypertension: Secondary | ICD-10-CM

## 2023-05-10 DIAGNOSIS — J3089 Other allergic rhinitis: Secondary | ICD-10-CM

## 2023-05-10 MED ORDER — OMEPRAZOLE 20 MG PO CPDR: 20.0000 mg | DELAYED_RELEASE_CAPSULE | Freq: Every day | ORAL | 1 refills | Status: DC

## 2023-05-10 MED ORDER — HYDROCHLOROTHIAZIDE 12.5 MG PO TABS
12.5000 mg | ORAL_TABLET | Freq: Every day | ORAL | 1 refills | Status: DC
Start: 2023-05-10 — End: 2023-07-23

## 2023-05-10 MED ORDER — LEVOCETIRIZINE DIHYDROCHLORIDE 5 MG PO TABS
5.0000 mg | ORAL_TABLET | Freq: Every evening | ORAL | 1 refills | Status: DC
Start: 2023-05-10 — End: 2024-01-21

## 2023-07-02 ENCOUNTER — Other Ambulatory Visit: Payer: Self-pay | Admitting: Adult Health

## 2023-07-02 DIAGNOSIS — E782 Mixed hyperlipidemia: Secondary | ICD-10-CM

## 2023-07-23 ENCOUNTER — Ambulatory Visit: Payer: Managed Care, Other (non HMO) | Admitting: Adult Health

## 2023-07-23 ENCOUNTER — Encounter: Payer: Self-pay | Admitting: Adult Health

## 2023-07-23 VITALS — BP 125/88 | HR 71 | Temp 97.9°F | Resp 18 | Ht 72.0 in | Wt 260.2 lb

## 2023-07-23 DIAGNOSIS — K219 Gastro-esophageal reflux disease without esophagitis: Secondary | ICD-10-CM | POA: Diagnosis not present

## 2023-07-23 DIAGNOSIS — E782 Mixed hyperlipidemia: Secondary | ICD-10-CM

## 2023-07-23 DIAGNOSIS — I10 Essential (primary) hypertension: Secondary | ICD-10-CM | POA: Diagnosis not present

## 2023-07-23 DIAGNOSIS — R7303 Prediabetes: Secondary | ICD-10-CM | POA: Diagnosis not present

## 2023-07-23 DIAGNOSIS — G8929 Other chronic pain: Secondary | ICD-10-CM

## 2023-07-23 DIAGNOSIS — F17209 Nicotine dependence, unspecified, with unspecified nicotine-induced disorders: Secondary | ICD-10-CM

## 2023-07-23 DIAGNOSIS — J3089 Other allergic rhinitis: Secondary | ICD-10-CM

## 2023-07-23 MED ORDER — ROSUVASTATIN CALCIUM 5 MG PO TABS: 5.0000 mg | ORAL_TABLET | Freq: Every day | ORAL | 0 refills | Status: AC

## 2023-07-23 MED ORDER — HYDROCHLOROTHIAZIDE 12.5 MG PO TABS: 12.5000 mg | ORAL_TABLET | Freq: Every day | ORAL | 1 refills | Status: DC

## 2023-07-23 MED ORDER — OMEPRAZOLE 20 MG PO CPDR: 20.0000 mg | DELAYED_RELEASE_CAPSULE | Freq: Every day | ORAL | 1 refills | Status: DC

## 2023-07-23 NOTE — Progress Notes (Unsigned)
[facility] clinic  Provider:  Kenard Gower DNP  Code Status:  Full Code  Goals of Care:     04/22/2023    8:44 AM  Advanced Directives  Does Patient Have a Medical Advance Directive? No  Would patient like information on creating a medical advance directive? No - Patient declined     Chief Complaint  Patient presents with   Follow-up    3 mths    Immunizations    Influenza    HPI: Patient is a 42 y.o. male seen today for an acute visit for   Wt Readings from Last 3 Encounters:  07/23/23 260 lb 3.2 oz (118 kg)  04/22/23 271 lb 6.4 oz (123.1 kg)  03/17/23 266 lb (120.7 kg)     Past Medical History:  Diagnosis Date   Allergy    GERD (gastroesophageal reflux disease)    Hyperlipidemia    Hypertension     Past Surgical History:  Procedure Laterality Date   ABDOMINAL SURGERY      Allergies  Allergen Reactions   Other Itching and Rash   Pineapple     Itching     Outpatient Encounter Medications as of 07/23/2023  Medication Sig   cyclobenzaprine (FLEXERIL) 10 MG tablet Take 1 tablet (10 mg total) by mouth at bedtime as needed for muscle spasms.   fluticasone (FLONASE) 50 MCG/ACT nasal spray Place 1 spray into both nostrils daily.   hydrochlorothiazide (HYDRODIURIL) 12.5 MG tablet Take 1 tablet (12.5 mg total) by mouth daily.   levocetirizine (XYZAL) 5 MG tablet Take 1 tablet (5 mg total) by mouth every evening.   omeprazole (PRILOSEC) 20 MG capsule Take 1 capsule (20 mg total) by mouth daily.   rosuvastatin (CRESTOR) 5 MG tablet Take 1 tablet by mouth once daily   No facility-administered encounter medications on file as of 07/23/2023.    Review of Systems:  Review of Systems  Constitutional:  Negative for activity change, appetite change and fever.  HENT:  Negative for sore throat.   Eyes: Negative.   Cardiovascular:  Negative for chest pain and leg swelling.  Gastrointestinal:  Negative for abdominal distention, diarrhea and vomiting.   Genitourinary:  Negative for dysuria, frequency and urgency.  Skin:  Negative for color change.  Neurological:  Negative for dizziness and headaches.  Psychiatric/Behavioral:  Negative for behavioral problems and sleep disturbance. The patient is not nervous/anxious.     Health Maintenance  Topic Date Due   INFLUENZA VACCINE  12/27/2023 (Originally 04/29/2023)   DTaP/Tdap/Td (2 - Td or Tdap) 01/21/2032   Hepatitis C Screening  Completed   HIV Screening  Completed   HPV VACCINES  Aged Out   COVID-19 Vaccine  Discontinued    Physical Exam: Vitals:   07/23/23 0856  Height: 6' (1.829 m)   Body mass index is 36.81 kg/m. Physical Exam Constitutional:      Appearance: Normal appearance.  HENT:     Head: Normocephalic and atraumatic.     Mouth/Throat:     Mouth: Mucous membranes are moist.  Eyes:     Conjunctiva/sclera: Conjunctivae normal.  Cardiovascular:     Rate and Rhythm: Normal rate and regular rhythm.     Pulses: Normal pulses.     Heart sounds: Normal heart sounds.  Pulmonary:     Effort: Pulmonary effort is normal.     Breath sounds: Normal breath sounds.  Abdominal:     General: Bowel sounds are normal.     Palpations: Abdomen is  soft.  Musculoskeletal:        General: No swelling. Normal range of motion.     Cervical back: Normal range of motion.  Skin:    General: Skin is warm and dry.  Neurological:     General: No focal deficit present.     Mental Status: He is alert and oriented to person, place, and time.  Psychiatric:        Mood and Affect: Mood normal.        Behavior: Behavior normal.        Thought Content: Thought content normal.        Judgment: Judgment normal.     Labs reviewed: Basic Metabolic Panel: Recent Labs    03/17/23 0946  NA 138  K 4.1  CL 103  CO2 29  GLUCOSE 92  BUN 13  CREATININE 1.25  CALCIUM 9.8   Liver Function Tests: Recent Labs    03/17/23 0946  AST 23  ALT 18  BILITOT 0.5  PROT 7.1   No results for  input(s): "LIPASE", "AMYLASE" in the last 8760 hours. No results for input(s): "AMMONIA" in the last 8760 hours. CBC: Recent Labs    03/17/23 0946  WBC 6.4  NEUTROABS 3,418  HGB 15.2  HCT 45.2  MCV 88.1  PLT 368   Lipid Panel: Recent Labs    09/24/22 1125  CHOL 229*  HDL 41  LDLCALC 159*  TRIG 160*  CHOLHDL 5.6*   Lab Results  Component Value Date   HGBA1C 5.9 (H) 03/17/2023    Procedures since last visit: No results found.  Assessment/Plan      Labs/tests ordered:  * No order type specified * Next appt:  Visit date not found

## 2023-07-24 LAB — LIPID PANEL
Cholesterol: 180 mg/dL (ref <200)
HDL: 40 mg/dL (ref >=40)
LDL Cholesterol (Calc): 114 mg/dL — ABNORMAL HIGH
Non-HDL Cholesterol (Calc): 140 mg/dL — ABNORMAL HIGH (ref <130)
Total CHOL/HDL Ratio: 4.5 (calc) (ref <5.0)
Triglycerides: 145 mg/dL (ref <150)

## 2023-07-24 LAB — HEMOGLOBIN A1C
Hgb A1c MFr Bld: 5.9 %{Hb} — ABNORMAL HIGH (ref <5.7)
Mean Plasma Glucose: 123 mg/dL
eAG (mmol/L): 6.8 mmol/L

## 2023-07-27 NOTE — Progress Notes (Signed)
-  Cholesterol 118, down from 229 -   Triglycerides 145, down from 160 -   LDL 114, down from 159 -   Continue rosuvastatin -   A1c remains at 5.9, ranging as prediabetic

## 2023-08-03 ENCOUNTER — Other Ambulatory Visit: Payer: Self-pay

## 2023-09-02 ENCOUNTER — Encounter: Payer: Self-pay | Admitting: Adult Health

## 2023-09-05 ENCOUNTER — Encounter: Payer: Self-pay | Admitting: Adult Health

## 2023-09-05 DIAGNOSIS — I10 Essential (primary) hypertension: Secondary | ICD-10-CM

## 2023-09-06 MED ORDER — HYDROCHLOROTHIAZIDE 12.5 MG PO TABS: 12.5000 mg | ORAL_TABLET | Freq: Every day | ORAL | 1 refills | Status: DC

## 2023-09-30 ENCOUNTER — Ambulatory Visit (INDEPENDENT_AMBULATORY_CARE_PROVIDER_SITE_OTHER): Payer: Managed Care, Other (non HMO) | Admitting: Adult Health

## 2023-09-30 ENCOUNTER — Encounter: Payer: Self-pay | Admitting: Adult Health

## 2023-09-30 VITALS — BP 132/78 | HR 83 | Temp 97.7°F | Resp 18 | Ht 72.0 in | Wt 261.2 lb

## 2023-09-30 DIAGNOSIS — R7303 Prediabetes: Secondary | ICD-10-CM | POA: Diagnosis not present

## 2023-09-30 DIAGNOSIS — I10 Essential (primary) hypertension: Secondary | ICD-10-CM | POA: Diagnosis not present

## 2023-09-30 DIAGNOSIS — K219 Gastro-esophageal reflux disease without esophagitis: Secondary | ICD-10-CM | POA: Diagnosis not present

## 2023-09-30 DIAGNOSIS — K921 Melena: Secondary | ICD-10-CM

## 2023-09-30 MED ORDER — OMEPRAZOLE 40 MG PO CPDR: 40.0000 mg | DELAYED_RELEASE_CAPSULE | Freq: Every day | ORAL | 3 refills | Status: DC

## 2023-09-30 MED ORDER — OMEPRAZOLE 20 MG PO CPDR: 40.0000 mg | DELAYED_RELEASE_CAPSULE | Freq: Every day | ORAL | Status: DC

## 2023-09-30 NOTE — Patient Instructions (Signed)
Gastroesophageal Reflux Disease, Adult  Gastroesophageal reflux (GER) happens when acid from the stomach flows up into the tube that connects the mouth and the stomach (esophagus). Normally, food travels down the esophagus and stays in the stomach to be digested. With GER, food and stomach acid sometimes move back up into the esophagus. You may have a disease called gastroesophageal reflux disease (GERD) if the reflux: Happens often. Causes frequent or very bad symptoms. Causes problems such as damage to the esophagus. When this happens, the esophagus becomes sore and swollen. Over time, GERD can make small holes (ulcers) in the lining of the esophagus. What are the causes? This condition is caused by a problem with the muscle between the esophagus and the stomach. When this muscle is weak or not normal, it does not close properly to keep food and acid from coming back up from the stomach. The muscle can be weak because of: Tobacco use. Pregnancy. Having a certain type of hernia (hiatal hernia). Alcohol use. Certain foods and drinks, such as coffee, chocolate, onions, and peppermint. What increases the risk? Being overweight. Having a disease that affects your connective tissue. Taking NSAIDs, such a ibuprofen. What are the signs or symptoms? Heartburn. Difficult or painful swallowing. The feeling of having a lump in the throat. A bitter taste in the mouth. Bad breath. Having a lot of saliva. Having an upset or bloated stomach. Burping. Chest pain. Different conditions can cause chest pain. Make sure you see your doctor if you have chest pain. Shortness of breath or wheezing. A long-term cough or a cough at night. Wearing away of the surface of teeth (tooth enamel). Weight loss. How is this treated? Making changes to your diet. Taking medicine. Having surgery. Treatment will depend on how bad your symptoms are. Follow these instructions at home: Eating and drinking  Follow a  diet as told by your doctor. You may need to avoid foods and drinks such as: Coffee and tea, with or without caffeine. Drinks that contain alcohol. Energy drinks and sports drinks. Bubbly (carbonated) drinks or sodas. Chocolate and cocoa. Peppermint and mint flavorings. Garlic and onions. Horseradish. Spicy and acidic foods. These include peppers, chili powder, curry powder, vinegar, hot sauces, and BBQ sauce. Citrus fruit juices and citrus fruits, such as oranges, lemons, and limes. Tomato-based foods. These include red sauce, chili, salsa, and pizza with red sauce. Fried and fatty foods. These include donuts, french fries, potato chips, and high-fat dressings. High-fat meats. These include hot dogs, rib eye steak, sausage, ham, and bacon. High-fat dairy items, such as whole milk, butter, and cream cheese. Eat small meals often. Avoid eating large meals. Avoid drinking large amounts of liquid with your meals. Avoid eating meals during the 2-3 hours before bedtime. Avoid lying down right after you eat. Do not exercise right after you eat. Lifestyle  Do not smoke or use any products that contain nicotine or tobacco. If you need help quitting, ask your doctor. Try to lower your stress. If you need help doing this, ask your doctor. If you are overweight, lose an amount of weight that is healthy for you. Ask your doctor about a safe weight loss goal. General instructions Pay attention to any changes in your symptoms. Take over-the-counter and prescription medicines only as told by your doctor. Do not take aspirin, ibuprofen, or other NSAIDs unless your doctor says it is okay. Wear loose clothes. Do not wear anything tight around your waist. Raise (elevate) the head of your bed about   6 inches (15 cm). You may need to use a wedge to do this. Avoid bending over if this makes your symptoms worse. Keep all follow-up visits. Contact a doctor if: You have new symptoms. You lose weight and you  do not know why. You have trouble swallowing or it hurts to swallow. You have wheezing or a cough that keeps happening. You have a hoarse voice. Your symptoms do not get better with treatment. Get help right away if: You have sudden pain in your arms, neck, jaw, teeth, or back. You suddenly feel sweaty, dizzy, or light-headed. You have chest pain or shortness of breath. You vomit and the vomit is green, yellow, or black, or it looks like blood or coffee grounds. You faint. Your poop (stool) is red, bloody, or black. You cannot swallow, drink, or eat. These symptoms may represent a serious problem that is an emergency. Do not wait to see if the symptoms will go away. Get medical help right away. Call your local emergency services (911 in the [place] Do not drive yourself to the hospital. Summary If a person has gastroesophageal reflux disease (GERD), food and stomach acid move back up into the esophagus and cause symptoms or problems such as damage to the esophagus. Treatment will depend on how bad your symptoms are. Follow a diet as told by your doctor. Take all medicines only as told by your doctor. This information is not intended to replace advice given to you by your health care provider. Make sure you discuss any questions you have with your health care provider. Document Revised: 03/25/2020 Document Reviewed: 03/25/2020 Elsevier Patient Education  2024 [employer] Inc[employer]  

## 2023-09-30 NOTE — Progress Notes (Signed)
 [facility] clinic  Provider:  Jereld Serum DNP  Code Status:  Full Code  Goals of Care:     07/23/2023    9:08 AM  Advanced Directives  Does Patient Have a Medical Advance Directive? No  Would patient like information on creating a medical advance directive? No - Patient declined     Chief Complaint  Patient presents with   Acute Visit    My stomach the acid has been really bad and 1 time it was blood in my stool and my stomach been burning a lot      HPI: Patient is a 43 y.o. male seen today for an acute visit for stomach acidity and blood in stool. A week ago, he ate a piece of salmon in a restaurant then that night he had burning on throat and stomach and continued X 3 days. He  Took Mylanta 30 ml X 2-3 days then acid in the stomach calmed down. He takes Omeprazole  20 mg daily for GERD.  He had noted slight blood in his stool X 1.  Today, he is still having acidity in his stomach.  Drinks alcohol  2-3X/month  Primary hypertension  -  BP 132/78, takes Hydrodiuril   Prediabetes  -  exercise in the gym 3-4X/week, walks a lot, not eating a lot of fried food and sweets, eats lot of fruits  Past Medical History:  Diagnosis Date   Allergy    GERD (gastroesophageal reflux disease)    Hyperlipidemia    Hypertension     Past Surgical History:  Procedure Laterality Date   ABDOMINAL SURGERY      Allergies  Allergen Reactions   Other Itching and Rash   Pineapple     Itching     Outpatient Encounter Medications as of 09/30/2023  Medication Sig   cyclobenzaprine  (FLEXERIL ) 10 MG tablet Take 1 tablet (10 mg total) by mouth at bedtime as needed for muscle spasms.   fluticasone  (FLONASE ) 50 MCG/ACT nasal spray Place 1 spray into both nostrils daily.   hydrochlorothiazide  (HYDRODIURIL ) 12.5 MG tablet Take 1 tablet (12.5 mg total) by mouth daily.   levocetirizine (XYZAL ) 5 MG tablet Take 1 tablet (5 mg total) by mouth every evening.   omeprazole  (PRILOSEC) 20 MG capsule  Take 1 capsule (20 mg total) by mouth daily.   rosuvastatin  (CRESTOR ) 5 MG tablet Take 1 tablet (5 mg total) by mouth daily.   No facility-administered encounter medications on file as of 09/30/2023.    Review of Systems:  Review of Systems  Constitutional:  Negative for activity change, appetite change and fever.  HENT:  Negative for sore throat.   Eyes: Negative.   Cardiovascular:  Negative for chest pain and leg swelling.  Gastrointestinal:  Positive for abdominal distention and blood in stool. Negative for diarrhea and vomiting.  Genitourinary:  Negative for dysuria, frequency and urgency.  Skin:  Negative for color change.  Neurological:  Negative for dizziness and headaches.  Psychiatric/Behavioral:  Negative for behavioral problems and sleep disturbance. The patient is not nervous/anxious.     Health Maintenance  Topic Date Due   INFLUENZA VACCINE  12/27/2023 (Originally 04/29/2023)   DTaP/Tdap/Td (2 - Td or Tdap) 01/21/2032   Hepatitis C Screening  Completed   HIV Screening  Completed   HPV VACCINES  Aged Out   COVID-19 Vaccine  Discontinued    Physical Exam: Vitals:   09/30/23 1307  BP: 132/78  Pulse: 83  Resp: 18  Temp: 97.7 F (36.5  C)  SpO2: 98%  Weight: 261 lb 3.2 oz (118.5 kg)  Height: 6' (1.829 m)   Body mass index is 35.43 kg/m. Physical Exam Constitutional:      Appearance: Normal appearance.  HENT:     Head: Normocephalic and atraumatic.     Mouth/Throat:     Mouth: Mucous membranes are moist.  Eyes:     Conjunctiva/sclera: Conjunctivae normal.  Cardiovascular:     Rate and Rhythm: Normal rate and regular rhythm.     Pulses: Normal pulses.     Heart sounds: Normal heart sounds.  Pulmonary:     Effort: Pulmonary effort is normal.     Breath sounds: Normal breath sounds.  Abdominal:     General: Bowel sounds are normal.     Palpations: Abdomen is soft.  Musculoskeletal:        General: No swelling. Normal range of motion.     Cervical back:  Normal range of motion.  Skin:    General: Skin is warm and dry.  Neurological:     General: No focal deficit present.     Mental Status: He is alert and oriented to person, place, and time.  Psychiatric:        Mood and Affect: Mood normal.        Behavior: Behavior normal.        Thought Content: Thought content normal.        Judgment: Judgment normal.     Labs reviewed: Basic Metabolic Panel: Recent Labs    03/17/23 0946  NA 138  K 4.1  CL 103  CO2 29  GLUCOSE 92  BUN 13  CREATININE 1.25  CALCIUM  9.8   Liver Function Tests: Recent Labs    03/17/23 0946  AST 23  ALT 18  BILITOT 0.5  PROT 7.1   No results for input(s): LIPASE, AMYLASE in the last 8760 hours. No results for input(s): AMMONIA in the last 8760 hours. CBC: Recent Labs    03/17/23 0946  WBC 6.4  NEUTROABS 3,418  HGB 15.2  HCT 45.2  MCV 88.1  PLT 368   Lipid Panel: Recent Labs    07/23/23 0921  CHOL 180  HDL 40  LDLCALC 114*  TRIG 145  CHOLHDL 4.5   Lab Results  Component Value Date   HGBA1C 5.9 (H) 07/23/2023    Procedures since last visit: No results found.  Assessment/Plan  1. Gastroesophageal reflux disease without esophagitis (Primary) -   probably reacted to the spices on the salmon -  will increase Omeprazole  from 20 mg to 40 mg daily - CBC With Differential/Platelet - Complete Metabolic Panel with eGFR - omeprazole  (PRILOSEC) 40 MG capsule; Take 1 capsule (40 mg total) by mouth daily.  Dispense: 30 capsule; Refill: 3  2. Blood in stool -  noted blood in stool X 1 -  will check CBC  3. Primary hypertension -  BP is stable -  continue Hydrodiuril   4. Prediabetes Lab Results  Component Value Date   HGBA1C 5.9 (H) 07/23/2023    -  diet-controlled   Labs/tests ordered:   CBC, CMP  Next appt:  01/21/2024

## 2023-10-01 LAB — COMPLETE METABOLIC PANEL WITH GFR
AG Ratio: 2 (calc) (ref 1.0–2.5)
ALT: 14 U/L (ref 9–46)
AST: 19 U/L (ref 10–40)
Albumin: 4.7 g/dL (ref 3.6–5.1)
Alkaline phosphatase (APISO): 70 U/L (ref 36–130)
BUN: 11 mg/dL (ref 7–25)
CO2: 27 mmol/L (ref 20–32)
Calcium: 8.9 mg/dL (ref 8.6–10.3)
Chloride: 105 mmol/L (ref 98–110)
Creat: 1.09 mg/dL (ref 0.60–1.29)
Globulin: 2.4 g/dL (ref 1.9–3.7)
Glucose, Bld: 74 mg/dL (ref 65–139)
Potassium: 4 mmol/L (ref 3.5–5.3)
Sodium: 139 mmol/L (ref 135–146)
Total Bilirubin: 0.4 mg/dL (ref 0.2–1.2)
Total Protein: 7.1 g/dL (ref 6.1–8.1)
eGFR: 87 mL/min/{1.73_m2} (ref >=60)

## 2023-10-01 LAB — CBC WITH DIFFERENTIAL/PLATELET
Absolute Lymphocytes: 3176 {cells}/uL (ref 850–3900)
Absolute Monocytes: 600 {cells}/uL (ref 200–950)
Basophils Absolute: 40 {cells}/uL (ref 0–200)
Basophils Relative: 0.5 %
Eosinophils Absolute: 134 {cells}/uL (ref 15–500)
Eosinophils Relative: 1.7 %
HCT: 45.1 % (ref 38.5–50.0)
Hemoglobin: 15.2 g/dL (ref 13.2–17.1)
MCH: 30.1 pg (ref 27.0–33.0)
MCHC: 33.7 g/dL (ref 32.0–36.0)
MCV: 89.3 fL (ref 80.0–100.0)
MPV: 9.8 fL (ref 7.5–12.5)
Monocytes Relative: 7.6 %
Neutro Abs: 3950 {cells}/uL (ref 1500–7800)
Neutrophils Relative %: 50 %
Platelets: 361 10*3/uL (ref 140–400)
RBC: 5.05 10*6/uL (ref 4.20–5.80)
RDW: 12.9 % (ref 11.0–15.0)
Total Lymphocyte: 40.2 %
WBC: 7.9 10*3/uL (ref 3.8–10.8)

## 2023-10-03 NOTE — Progress Notes (Signed)
-    electrolytes, liver enzyme and kidney function norma -  CBC all normal, same as 6 months ago, no anemia -  no new order

## 2023-11-14 ENCOUNTER — Encounter: Payer: Self-pay | Admitting: Adult Health

## 2023-11-15 ENCOUNTER — Ambulatory Visit: Payer: Managed Care, Other (non HMO) | Admitting: Adult Health

## 2023-11-15 ENCOUNTER — Ambulatory Visit
Admission: RE | Admit: 2023-11-15 | Discharge: 2023-11-15 | Disposition: A | Discharge status: Home / Self Care | Payer: Managed Care, Other (non HMO) | Source: Ambulatory Visit | Attending: Adult Health | Admitting: Adult Health

## 2023-11-15 ENCOUNTER — Encounter: Payer: Self-pay | Admitting: Adult Health

## 2023-11-15 DIAGNOSIS — M79604 Pain in right leg: Secondary | ICD-10-CM

## 2023-11-15 DIAGNOSIS — M79641 Pain in right hand: Secondary | ICD-10-CM

## 2023-11-15 DIAGNOSIS — M25521 Pain in right elbow: Secondary | ICD-10-CM | POA: Diagnosis not present

## 2023-11-15 DIAGNOSIS — R7303 Prediabetes: Secondary | ICD-10-CM

## 2023-11-15 DIAGNOSIS — R519 Headache, unspecified: Secondary | ICD-10-CM

## 2023-11-15 DIAGNOSIS — K219 Gastro-esophageal reflux disease without esophagitis: Secondary | ICD-10-CM

## 2023-11-15 DIAGNOSIS — I10 Essential (primary) hypertension: Secondary | ICD-10-CM

## 2023-11-15 MED ORDER — ACETAMINOPHEN 325 MG PO TABS: 650.0000 mg | ORAL_TABLET | Freq: Four times a day (QID) | ORAL | Status: AC | PRN
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
## 2023-11-16 ENCOUNTER — Other Ambulatory Visit: Payer: Self-pay | Admitting: Adult Health

## 2023-11-16 ENCOUNTER — Telehealth: Payer: Self-pay

## 2023-11-16 DIAGNOSIS — R519 Headache, unspecified: Secondary | ICD-10-CM

## 2023-11-16 LAB — HEMOGLOBIN A1C
Hgb A1c MFr Bld: 5.8 %{Hb} — ABNORMAL HIGH (ref <5.7)
Mean Plasma Glucose: 120 mg/dL
eAG (mmol/L): 6.6 mmol/L

## 2023-11-16 NOTE — Telephone Encounter (Signed)
Patient declined CT head during clinic visit. If you would like, I can order CT head.

## 2023-11-16 NOTE — Telephone Encounter (Signed)
Shane Pena with [facility] called to emphasize that we need to complete prior authorization for CT Scan in order for them to schedule

## 2023-11-16 NOTE — Telephone Encounter (Signed)
CT head without contrast ordered. FYI.

## 2023-11-16 NOTE — Progress Notes (Signed)
Right femur, hand, tib/fib and elbow imaging were negative for fracture.

## 2023-11-16 NOTE — Telephone Encounter (Signed)
Sent to [facility] [address]. Usually they will need prior authorization from insurance. Pls call them with the number that was given to you yesterday and ask when you can go there.

## 2023-11-16 NOTE — Progress Notes (Signed)
A1C 5.8, down from 5.9 (taken 3 months ago), still ranging as prediabetes.

## 2023-11-17 NOTE — Telephone Encounter (Signed)
Checking for auth- it was submitted to [facility] already.

## 2023-11-19 ENCOUNTER — Inpatient Hospital Stay: Admission: RE | Admit: 2023-11-19 | Payer: Managed Care, Other (non HMO) | Source: Ambulatory Visit

## 2023-11-23 ENCOUNTER — Ambulatory Visit
Admission: RE | Admit: 2023-11-23 | Discharge: 2023-11-23 | Disposition: A | Discharge status: Home / Self Care | Payer: Managed Care, Other (non HMO) | Source: Ambulatory Visit | Attending: Adult Health | Admitting: Adult Health

## 2023-11-23 DIAGNOSIS — R519 Headache, unspecified: Secondary | ICD-10-CM

## 2023-12-14 NOTE — Progress Notes (Signed)
[place] head normal

## 2024-01-21 ENCOUNTER — Ambulatory Visit: Payer: Managed Care, Other (non HMO) | Admitting: Adult Health

## 2024-01-21 ENCOUNTER — Encounter: Payer: Self-pay | Admitting: Adult Health

## 2024-01-21 VITALS — BP 128/90 | HR 82 | Temp 97.6°F | Resp 21 | Ht 72.0 in | Wt 259.2 lb

## 2024-01-21 DIAGNOSIS — E669 Obesity, unspecified: Secondary | ICD-10-CM

## 2024-01-21 DIAGNOSIS — E782 Mixed hyperlipidemia: Secondary | ICD-10-CM

## 2024-01-21 DIAGNOSIS — F17209 Nicotine dependence, unspecified, with unspecified nicotine-induced disorders: Secondary | ICD-10-CM | POA: Diagnosis not present

## 2024-01-21 DIAGNOSIS — I10 Essential (primary) hypertension: Secondary | ICD-10-CM

## 2024-01-21 DIAGNOSIS — K219 Gastro-esophageal reflux disease without esophagitis: Secondary | ICD-10-CM

## 2024-01-21 DIAGNOSIS — R7303 Prediabetes: Secondary | ICD-10-CM

## 2024-01-21 DIAGNOSIS — J3089 Other allergic rhinitis: Secondary | ICD-10-CM

## 2024-01-21 MED ORDER — LEVOCETIRIZINE DIHYDROCHLORIDE 5 MG PO TABS: 5.0000 mg | ORAL_TABLET | Freq: Every evening | ORAL | 1 refills | Status: DC
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
## 2024-01-22 LAB — LIPID PANEL
Cholesterol: 188 mg/dL (ref <200)
HDL: 40 mg/dL (ref >=40)
LDL Cholesterol (Calc): 125 mg/dL — ABNORMAL HIGH
Non-HDL Cholesterol (Calc): 148 mg/dL — ABNORMAL HIGH (ref <130)
Total CHOL/HDL Ratio: 4.7 (calc) (ref <5.0)
Triglycerides: 122 mg/dL (ref <150)

## 2024-01-24 NOTE — Progress Notes (Signed)
-    LDL 125, up from 114, increase vegetable intake and exercise for at least 150 minutes/week, mediterranean diet is highly recommended

## 2024-07-24 ENCOUNTER — Encounter: Admitting: Adult Health

## 2024-07-24 DIAGNOSIS — J3089 Other allergic rhinitis: Secondary | ICD-10-CM

## 2024-07-24 DIAGNOSIS — K219 Gastro-esophageal reflux disease without esophagitis: Secondary | ICD-10-CM

## 2024-07-24 DIAGNOSIS — E785 Hyperlipidemia, unspecified: Secondary | ICD-10-CM

## 2024-07-24 DIAGNOSIS — I10 Essential (primary) hypertension: Secondary | ICD-10-CM

## 2024-07-24 NOTE — Progress Notes (Signed)
 This encounter was created in error - please disregard.

## 2024-08-01 ENCOUNTER — Encounter: Payer: Self-pay | Admitting: Adult Health

## 2024-08-01 ENCOUNTER — Ambulatory Visit: Payer: Self-pay | Admitting: Adult Health

## 2024-08-01 VITALS — BP 136/88 | HR 100 | Temp 98.0°F | Ht 72.0 in | Wt 183.8 lb

## 2024-08-01 DIAGNOSIS — J3089 Other allergic rhinitis: Secondary | ICD-10-CM

## 2024-08-01 DIAGNOSIS — K219 Gastro-esophageal reflux disease without esophagitis: Secondary | ICD-10-CM | POA: Diagnosis not present

## 2024-08-01 DIAGNOSIS — R7303 Prediabetes: Secondary | ICD-10-CM

## 2024-08-01 DIAGNOSIS — M545 Low back pain, unspecified: Secondary | ICD-10-CM

## 2024-08-01 DIAGNOSIS — I10 Essential (primary) hypertension: Secondary | ICD-10-CM | POA: Diagnosis not present

## 2024-08-01 DIAGNOSIS — G8929 Other chronic pain: Secondary | ICD-10-CM

## 2024-08-01 DIAGNOSIS — E782 Mixed hyperlipidemia: Secondary | ICD-10-CM

## 2024-08-01 DIAGNOSIS — F17209 Nicotine dependence, unspecified, with unspecified nicotine-induced disorders: Secondary | ICD-10-CM

## 2024-08-01 MED ORDER — LEVOCETIRIZINE DIHYDROCHLORIDE 5 MG PO TABS: 5.0000 mg | ORAL_TABLET | Freq: Every day | ORAL | Status: AC | PRN

## 2024-08-01 MED ORDER — FLUTICASONE PROPIONATE 50 MCG/ACT NA SUSP: 1.0000 | Freq: Every day | NASAL | Status: AC | PRN

## 2024-08-01 MED ORDER — OMEPRAZOLE 40 MG PO CPDR: 40.0000 mg | DELAYED_RELEASE_CAPSULE | Freq: Every day | ORAL | Status: AC

## 2024-08-01 NOTE — Progress Notes (Signed)
 [facility] clinic  Provider:  Jereld Serum DNP  Code Status:  Full Code  Goals of Care:     07/23/2023    9:08 AM  Advanced Directives  Does Patient Have a Medical Advance Directive? No  Would patient like information on creating a medical advance directive? No - Patient declined     Chief Complaint  Patient presents with   Follow-up    Check up BP    Discussed the use of AI scribe software for clinical note transcription with the patient, who gave verbal consent to proceed.  HPI: Patient is a 43 y.o. male seen today for a 40-month follow up of chronic medical issues.  He has experienced significant weight loss, losing 70 pounds since April 2025, now weighing 183 pounds. This weight loss is attributed to increased physical activity and dietary changes, including reduced sugar and junk food intake. He works in a physically demanding job, constantly moving and designer, television/film set, and brings his own food to work due to a lack of nearby food options, following a low-salt diet.  He has mixed hyperlipidemia and is currently taking rosuvastatin  5 mg daily. His last cholesterol check in April 2025 showed an elevated LDL of 125 mg/dL, while his total cholesterol and triglycerides were within normal limits.  He is hypertensive and takes hydrochlorothiazide  12.5 mg daily. He has reduced his smoking from a pack a day to a pack every three days.  He experiences acid reflux but reports improvement since changing his diet. He takes omeprazole  40 mg daily.  He has chronic low back pain for which he occasionally uses Flexeril  10 mg at bedtime as needed, noting it causes significant drowsiness.  He uses Flonase  50 mcg as needed for seasonal allergies, particularly when pollen levels are high, and works outside frequently, which exacerbates his symptoms.  He is prediabetic with a last A1c of 5.8% in February 2025. He has been focusing on eating more vegetables and reducing sugar intake.      Past Medical History:  Diagnosis Date   Allergy    GERD (gastroesophageal reflux disease)    Hyperlipidemia    Hypertension     Past Surgical History:  Procedure Laterality Date   ABDOMINAL SURGERY      Allergies  Allergen Reactions   Other Itching and Rash   Pineapple     Itching     Outpatient Encounter Medications as of 08/01/2024  Medication Sig   cyclobenzaprine  (FLEXERIL ) 10 MG tablet Take 1 tablet (10 mg total) by mouth at bedtime as needed for muscle spasms.   hydrochlorothiazide  (HYDRODIURIL ) 12.5 MG tablet Take 1 tablet (12.5 mg total) by mouth daily.   rosuvastatin  (CRESTOR ) 5 MG tablet Take 1 tablet (5 mg total) by mouth daily.   [DISCONTINUED] omeprazole  (PRILOSEC) 40 MG capsule Take 1 capsule (40 mg total) by mouth daily.   acetaminophen  (TYLENOL ) 325 MG tablet Take 2 tablets (650 mg total) by mouth every 6 (six) hours as needed. (Patient not taking: Reported on 08/01/2024)   fluticasone  (FLONASE ) 50 MCG/ACT nasal spray Place 1 spray into both nostrils daily as needed for allergies or rhinitis.   levocetirizine (XYZAL ) 5 MG tablet Take 1 tablet (5 mg total) by mouth daily as needed for allergies.   omeprazole  (PRILOSEC) 40 MG capsule Take 1 capsule (40 mg total) by mouth daily.   [DISCONTINUED] fluticasone  (FLONASE ) 50 MCG/ACT nasal spray Place 1 spray into both nostrils daily. (Patient not taking: Reported on 08/01/2024)   [DISCONTINUED] levocetirizine (  XYZAL ) 5 MG tablet Take 1 tablet (5 mg total) by mouth every evening. (Patient not taking: Reported on 08/01/2024)   No facility-administered encounter medications on file as of 08/01/2024.    Review of Systems:  Review of Systems  Constitutional:  Negative for activity change, appetite change and fever.  HENT:  Negative for sore throat.   Eyes: Negative.   Cardiovascular:  Negative for chest pain and leg swelling.  Gastrointestinal:  Negative for abdominal distention, diarrhea and vomiting.  Genitourinary:   Negative for dysuria, frequency and urgency.  Skin:  Negative for color change.  Neurological:  Negative for dizziness and headaches.  Psychiatric/Behavioral:  Negative for behavioral problems and sleep disturbance. The patient is not nervous/anxious.     Health Maintenance  Topic Date Due   HPV VACCINES (1 - 3-dose SCDM series) Never done   Influenza Vaccine  12/26/2024 (Originally 04/28/2024)   Pneumococcal Vaccine (1 of 2 - PCV) 08/01/2025 (Originally 06/06/2000)   Hepatitis B Vaccines 19-59 Average Risk (1 of 3 - 19+ 3-dose series) 08/01/2025 (Originally 06/06/2000)   DTaP/Tdap/Td (2 - Td or Tdap) 01/21/2032   Hepatitis C Screening  Completed   HIV Screening  Completed   Meningococcal B Vaccine  Aged Out   COVID-19 Vaccine  Discontinued    Physical Exam: Vitals:   08/01/24 0826 08/01/24 0832  BP: (!) 136/94 136/88  Pulse: 100   Temp: 98 F (36.7 C)   TempSrc: Temporal   SpO2: 95%   Weight: 183 lb 12.8 oz (83.4 kg)   Height: 6' (1.829 m)    Body mass index is 24.93 kg/m. Physical Exam Constitutional:      Appearance: Normal appearance.  HENT:     Head: Normocephalic and atraumatic.     Mouth/Throat:     Mouth: Mucous membranes are moist.  Eyes:     Conjunctiva/sclera: Conjunctivae normal.  Cardiovascular:     Rate and Rhythm: Normal rate and regular rhythm.     Pulses: Normal pulses.     Heart sounds: Normal heart sounds.  Pulmonary:     Effort: Pulmonary effort is normal.     Breath sounds: Normal breath sounds.  Abdominal:     General: Bowel sounds are normal.     Palpations: Abdomen is soft.  Musculoskeletal:        General: No swelling. Normal range of motion.     Cervical back: Normal range of motion.  Skin:    General: Skin is warm and dry.  Neurological:     General: No focal deficit present.     Mental Status: He is alert and oriented to person, place, and time.  Psychiatric:        Mood and Affect: Mood normal.        Behavior: Behavior normal.      Labs reviewed: Basic Metabolic Panel: Recent Labs    09/30/23 1342  NA 139  K 4.0  CL 105  CO2 27  GLUCOSE 74  BUN 11  CREATININE 1.09  CALCIUM  8.9   Liver Function Tests: Recent Labs    09/30/23 1342  AST 19  ALT 14  BILITOT 0.4  PROT 7.1   No results for input(s): LIPASE, AMYLASE in the last 8760 hours. No results for input(s): AMMONIA in the last 8760 hours. CBC: Recent Labs    09/30/23 1342  WBC 7.9  NEUTROABS 3,950  HGB 15.2  HCT 45.1  MCV 89.3  PLT 361   Lipid Panel: Recent Labs  01/21/24 0912  CHOL 188  HDL 40  LDLCALC 125*  TRIG 122  CHOLHDL 4.7   Lab Results  Component Value Date   HGBA1C 5.8 (H) 11/15/2023    Procedures since last visit: No results found.  Assessment/Plan  1. Mixed hyperlipidemia (Primary) Lab Results  Component Value Date   CHOL 188 01/21/2024   HDL 40 01/21/2024   LDLCALC 125 (H) 01/21/2024   TRIG 122 01/21/2024   CHOLHDL 4.7 01/21/2024    -  Elevated LDL at 125 mg/dL, goal is less than 70 mg/dL. Cholesterol and triglycerides normal. - Continue rosuvastatin  5 mg daily.  2. Essential hypertension -  with current readings of 136/88 mmHg and 136/94 mmHg. Previous reading was 130/100 mmHg. Smoking may have affected readings. - Continue hydrochlorothiazide  12.5 mg daily. - Encourage reduction in smoking to improve blood pressure control.  3. Gastroesophageal reflux disease without esophagitis -  Well-controlled with lifestyle modifications and omeprazole  40 mg daily. No recent flare-ups. - Continue omeprazole  40 mg daily as needed. - Reinforce lifestyle modifications to prevent flare-ups. - omeprazole  (PRILOSEC) 40 MG capsule; Take 1 capsule (40 mg total) by mouth daily.  4. Tobacco use disorder, continuous -  Current smoking of one pack every three days, reduced from one pack per day. 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 - Encourage further reduction in smoking with a goal to quit.  5. Chronic low back  pain without sciatica, unspecified back pain laterality -  with occasional muscle spasms. Uses Flexeril  10 mg at bedtime as needed. - Continue Flexeril  10 mg at bedtime as needed for muscle spasms. - Advise against driving after taking Flexeril  due to drowsiness.  6. Environmental and seasonal allergies -  Symptoms primarily during pollen season. Uses Flonase  and Xyzal  as needed. - Use Flonase  50 mcg/ACT one spray in each nostril daily as needed. - Use Xyzal  as needed for allergy symptoms. - fluticasone  (FLONASE ) 50 MCG/ACT nasal spray; Place 1 spray into both nostrils daily as needed for allergies or rhinitis. - levocetirizine (XYZAL ) 5 MG tablet; Take 1 tablet (5 mg total) by mouth daily as needed for allergies.  7. Prediabetes -  Last A1c was 5.8%. Reports significant weight loss and dietary changes. - Continue current lifestyle modifications, including diet and exercise.   General Health Maintenance Declines flu vaccine due to past adverse reactions. Reports significant weight loss and improved BMI. - Encourage continued healthy lifestyle and weight management.      Labs/tests ordered:   get fasting labs next visit   Return in about 6 months (around 01/29/2025).  Shane Fugere Medina-Vargas, NP

## 2024-09-11 ENCOUNTER — Other Ambulatory Visit: Payer: Self-pay | Admitting: Adult Health

## 2024-09-11 ENCOUNTER — Other Ambulatory Visit: Payer: Self-pay

## 2024-09-11 DIAGNOSIS — I10 Essential (primary) hypertension: Secondary | ICD-10-CM

## 2024-09-11 MED ORDER — HYDROCHLOROTHIAZIDE 12.5 MG PO TABS: 12.5000 mg | ORAL_TABLET | Freq: Every day | ORAL | 1 refills | Status: DC

## 2024-09-17 ENCOUNTER — Encounter: Payer: Self-pay | Admitting: Adult Health

## 2024-09-18 ENCOUNTER — Encounter: Payer: Self-pay | Admitting: Adult Health

## 2024-09-18 ENCOUNTER — Ambulatory Visit: Admitting: Adult Health

## 2024-09-18 VITALS — BP 134/96 | HR 83 | Temp 97.7°F | Ht 72.0 in | Wt 262.4 lb

## 2024-09-18 DIAGNOSIS — F17200 Nicotine dependence, unspecified, uncomplicated: Secondary | ICD-10-CM

## 2024-09-18 DIAGNOSIS — Z6835 Body mass index (BMI) 35.0-35.9, adult: Secondary | ICD-10-CM | POA: Diagnosis not present

## 2024-09-18 DIAGNOSIS — R7303 Prediabetes: Secondary | ICD-10-CM | POA: Diagnosis not present

## 2024-09-18 DIAGNOSIS — E782 Mixed hyperlipidemia: Secondary | ICD-10-CM | POA: Diagnosis not present

## 2024-09-18 DIAGNOSIS — E66812 Obesity, class 2: Secondary | ICD-10-CM | POA: Diagnosis not present

## 2024-09-18 DIAGNOSIS — I10 Essential (primary) hypertension: Secondary | ICD-10-CM | POA: Diagnosis not present

## 2024-09-18 MED ORDER — HYDROCHLOROTHIAZIDE 25 MG PO TABS: 25.0000 mg | ORAL_TABLET | Freq: Every day | ORAL | 3 refills | Status: AC

## 2024-09-18 NOTE — Progress Notes (Signed)
 "  [facility]  Provider:   Code Status:  Full Code  Goals of Care:     07/23/2023    9:08 AM  Advanced Directives  Does Patient Have a Medical Advance Directive? No  Would patient like information on creating a medical advance directive? No - Patient declined     Chief Complaint  Patient presents with   Hypertension   Discussed the use of AI scribe software for clinical note transcription with the patient, who gave verbal consent to proceed.  HPI: Patient is a 43 y.o. male seen today for an acute visit for elevated blood pressure.  His blood pressure has been elevated for the past two to three days, with home readings fluctuating between 132/101 mmHg and 136/96-97 mmHg. He attributes a recent spike to consuming salty nachos, which is unusual for his diet as he typically avoids salt. He is currently taking hydrochlorothiazide  12.5 mg daily for hypertension.  He denies chest pain but has experienced headaches, which prompted him to check his blood pressure.  He continues to smoke [place] cigarettes, with a pack lasting about two to three days. [place] highways, which involves walking two to three miles a day. He generally maintains a diet of home-cooked meals, avoids salty foods, drinks water regularly, and occasionally consumes diluted juice.  He is also on Crestor  5 mg daily for hyperlipidemia and has a history of prediabetes with a last A1c of 5.8%.   Past Medical History:  Diagnosis Date   Allergy    GERD (gastroesophageal reflux disease)    Hyperlipidemia    Hypertension     Past Surgical History:  Procedure Laterality Date   ABDOMINAL SURGERY      Allergies[1]  Outpatient Encounter Medications as of 09/18/2024  Medication Sig   cyclobenzaprine  (FLEXERIL ) 10 MG tablet Take 1 tablet (10 mg total) by mouth at bedtime as needed for muscle spasms.   fluticasone  (FLONASE ) 50 MCG/ACT nasal spray Place 1 spray into both nostrils daily as needed for allergies or  rhinitis.   hydrochlorothiazide  (HYDRODIURIL ) 12.5 MG tablet Take 1 tablet (12.5 mg total) by mouth daily.   levocetirizine (XYZAL ) 5 MG tablet Take 1 tablet (5 mg total) by mouth daily as needed for allergies.   omeprazole  (PRILOSEC) 40 MG capsule Take 1 capsule (40 mg total) by mouth daily.   rosuvastatin  (CRESTOR ) 5 MG tablet Take 1 tablet (5 mg total) by mouth daily.   acetaminophen  (TYLENOL ) 325 MG tablet Take 2 tablets (650 mg total) by mouth every 6 (six) hours as needed. (Patient not taking: Reported on 09/18/2024)   No facility-administered encounter medications on file as of 09/18/2024.    Review of Systems:  Review of Systems  Constitutional:  Negative for activity change, appetite change and fever.  HENT:  Negative for sore throat.   Eyes: Negative.   Cardiovascular:  Negative for chest pain and leg swelling.  Gastrointestinal:  Negative for abdominal distention, diarrhea and vomiting.  Genitourinary:  Negative for dysuria, frequency and urgency.  Skin:  Negative for color change.  Neurological:  Positive for headaches. Negative for dizziness.  Psychiatric/Behavioral:  Negative for behavioral problems and sleep disturbance. The patient is not nervous/anxious.     Health Maintenance  Topic Date Due   HPV VACCINES (1 - 3-dose SCDM series) Never done   Influenza Vaccine  12/26/2024 (Originally 04/28/2024)   Pneumococcal Vaccine (1 of 2 - PCV) 08/01/2025 (Originally 06/06/2000)   Hepatitis B Vaccines 19-59 Average Risk (1 of  3 - 19+ 3-dose series) 08/01/2025 (Originally 06/06/2000)   DTaP/Tdap/Td (2 - Td or Tdap) 01/21/2032   Hepatitis C Screening  Completed   HIV Screening  Completed   Meningococcal B Vaccine  Aged Out   COVID-19 Vaccine  Discontinued    Physical Exam: Vitals:   09/18/24 1031 09/18/24 1035  BP: (!) 132/98 (!) 134/96  Pulse: 83   Temp: 97.7 F (36.5 C)   SpO2: 98%   Weight: 262 lb 6.4 oz (119 kg)   Height: 6' (1.829 m)    Body mass index is 35.59  kg/m. Physical Exam Constitutional:      Appearance: Normal appearance. He is obese.  HENT:     Head: Normocephalic and atraumatic.     Mouth/Throat:     Mouth: Mucous membranes are moist.  Eyes:     Conjunctiva/sclera: Conjunctivae normal.  Cardiovascular:     Rate and Rhythm: Normal rate and regular rhythm.     Pulses: Normal pulses.     Heart sounds: Normal heart sounds.  Pulmonary:     Effort: Pulmonary effort is normal.     Breath sounds: Normal breath sounds.  Abdominal:     General: Bowel sounds are normal.     Palpations: Abdomen is soft.  Musculoskeletal:        General: No swelling. Normal range of motion.     Cervical back: Normal range of motion.  Skin:    General: Skin is warm and dry.  Neurological:     General: No focal deficit present.     Mental Status: He is alert and oriented to person, place, and time.  Psychiatric:        Mood and Affect: Mood normal.        Behavior: Behavior normal.        Thought Content: Thought content normal.        Judgment: Judgment normal.     Labs reviewed: Basic Metabolic Panel: Recent Labs    09/30/23 1342  NA 139  K 4.0  CL 105  CO2 27  GLUCOSE 74  BUN 11  CREATININE 1.09  CALCIUM  8.9   Liver Function Tests: Recent Labs    09/30/23 1342  AST 19  ALT 14  BILITOT 0.4  PROT 7.1   No results for input(s): LIPASE, AMYLASE in the last 8760 hours. No results for input(s): AMMONIA in the last 8760 hours. CBC: Recent Labs    09/30/23 1342  WBC 7.9  NEUTROABS 3,950  HGB 15.2  HCT 45.1  MCV 89.3  PLT 361   Lipid Panel: Recent Labs    01/21/24 0912  CHOL 188  HDL 40  LDLCALC 125*  TRIG 122  CHOLHDL 4.7   Lab Results  Component Value Date   HGBA1C 5.8 (H) 11/15/2023    Procedures since last visit: No results found.  Assessment/Plan  1. Essential hypertension (Primary) -  Blood pressure elevated at 132/98 mmHg and 134/96 mmHg. Headaches noted. Recent salty food intake may  contribute. - Increased hydrochlorothiazide  to 25 mg daily. - Advised home blood pressure monitoring. - Encouraged dietary salt reduction. - Recommended bananas for potassium replacement. - hydrochlorothiazide  (HYDRODIURIL ) 25 MG tablet; Take 1 tablet (25 mg total) by mouth daily.  Dispense: 90 tablet; Refill: 3 - CBC with Differential/Platelets - Comprehensive metabolic panel  2. Prediabetes -  Last A1c was 5.8%. -  diet-controlled - Hemoglobin A1c  3. Mixed hyperlipidemia -  LDL cholesterol elevated last April. On Crestor  5 mg  daily. - Lipid Panel  4. Class 2 obesity with body mass index (BMI) of 35.0 to 35.9 in adult, unspecified obesity type, unspecified whether serious comorbidity present -  counseled on diet and exercise  5. Current smoker -  Smokes [place] cigarettes, one pack every two to three days. - Encouraged reduction in cigarette consumption.      Labs/tests ordered:  CMP, lipid panel, A1C, CBC  Next appt:  01/29/2025      [1]  Allergies Allergen Reactions   Other Itching and Rash   Pineapple     Itching    "

## 2024-09-19 ENCOUNTER — Ambulatory Visit: Payer: Self-pay | Admitting: Adult Health

## 2024-09-19 LAB — CBC WITH DIFFERENTIAL/PLATELET
Absolute Lymphocytes: 2394 {cells}/uL (ref 850–3900)
Absolute Monocytes: 372 {cells}/uL (ref 200–950)
Basophils Absolute: 50 {cells}/uL (ref 0–200)
Basophils Relative: 0.8 %
Eosinophils Absolute: 151 {cells}/uL (ref 15–500)
Eosinophils Relative: 2.4 %
HCT: 47 % (ref 39.4–51.1)
Hemoglobin: 15.9 g/dL (ref 13.2–17.1)
MCH: 30.4 pg (ref 27.0–33.0)
MCHC: 33.8 g/dL (ref 31.6–35.4)
MCV: 89.9 fL (ref 81.4–101.7)
MPV: 9.4 fL (ref 7.5–12.5)
Monocytes Relative: 5.9 %
Neutro Abs: 3333 {cells}/uL (ref 1500–7800)
Neutrophils Relative %: 52.9 %
Platelets: 340 Thousand/uL (ref 140–400)
RBC: 5.23 Million/uL (ref 4.20–5.80)
RDW: 13 % (ref 11.0–15.0)
Total Lymphocyte: 38 %
WBC: 6.3 Thousand/uL (ref 3.8–10.8)

## 2024-09-19 LAB — COMPREHENSIVE METABOLIC PANEL WITH GFR
AG Ratio: 2.1 (calc) (ref 1.0–2.5)
ALT: 15 U/L (ref 9–46)
AST: 19 U/L (ref 10–40)
Albumin: 4.8 g/dL (ref 3.6–5.1)
Alkaline phosphatase (APISO): 65 U/L (ref 36–130)
BUN: 12 mg/dL (ref 7–25)
CO2: 27 mmol/L (ref 20–32)
Calcium: 9.6 mg/dL (ref 8.6–10.3)
Chloride: 103 mmol/L (ref 98–110)
Creat: 1.23 mg/dL (ref 0.60–1.29)
Globulin: 2.3 g/dL (ref 1.9–3.7)
Glucose, Bld: 95 mg/dL (ref 65–99)
Potassium: 4.2 mmol/L (ref 3.5–5.3)
Sodium: 137 mmol/L (ref 135–146)
Total Bilirubin: 0.5 mg/dL (ref 0.2–1.2)
Total Protein: 7.1 g/dL (ref 6.1–8.1)
eGFR: 75 mL/min/1.73m2

## 2024-09-19 LAB — LIPID PANEL
Cholesterol: 190 mg/dL
HDL: 38 mg/dL — ABNORMAL LOW
LDL Cholesterol (Calc): 127 mg/dL — ABNORMAL HIGH
Non-HDL Cholesterol (Calc): 152 mg/dL — ABNORMAL HIGH
Total CHOL/HDL Ratio: 5 (calc) — ABNORMAL HIGH
Triglycerides: 132 mg/dL

## 2024-09-19 LAB — HEMOGLOBIN A1C
Hgb A1c MFr Bld: 5.7 % — ABNORMAL HIGH
Mean Plasma Glucose: 117 mg/dL
eAG (mmol/L): 6.5 mmol/L

## 2024-09-19 NOTE — Progress Notes (Signed)
-    LDL 127, up from 125, are you taking your Crestor  5 mg daily? No anemia -  A1C 5.7, down from 5.8, ranging as prediabetes, continue to exercise for at least 150 minutes/week, low fat diet -  electrolytes and liver enzymes normal

## 2024-10-27 ENCOUNTER — Encounter: Payer: Self-pay | Admitting: Adult Health

## 2024-11-01 ENCOUNTER — Ambulatory Visit: Admitting: Adult Health

## 2024-11-01 ENCOUNTER — Encounter: Payer: Self-pay | Admitting: Adult Health

## 2024-11-01 VITALS — BP 136/80 | HR 100 | Temp 97.3°F | Ht 72.0 in | Wt 280.8 lb

## 2024-11-01 DIAGNOSIS — I10 Essential (primary) hypertension: Secondary | ICD-10-CM

## 2024-11-01 DIAGNOSIS — J0110 Acute frontal sinusitis, unspecified: Secondary | ICD-10-CM | POA: Diagnosis not present

## 2024-11-01 DIAGNOSIS — K219 Gastro-esophageal reflux disease without esophagitis: Secondary | ICD-10-CM | POA: Diagnosis not present

## 2024-11-01 DIAGNOSIS — E782 Mixed hyperlipidemia: Secondary | ICD-10-CM

## 2024-11-01 MED ORDER — AMOXICILLIN 875 MG PO TABS: 875.0000 mg | ORAL_TABLET | Freq: Two times a day (BID) | ORAL | 0 refills | Status: AC

## 2024-11-01 NOTE — Progress Notes (Signed)
 "  [facility]  Provider:  Jereld Serum DNP  Code Status:  Full Code  Goals of Care:     07/23/2023    9:08 AM  Advanced Directives  Does Patient Have a Medical Advance Directive? No  Would patient like information on creating a medical advance directive? No - Patient declined     Chief Complaint  Patient presents with   Insomnia    He believes that his issue has since resolved since he got a humidifier. The problem was not him being unable to sleep  but it was when he woke up he was congested and dried out.    Ear Fullness    Discussed the use of AI scribe software for clinical note transcription with the patient, who gave verbal consent to proceed.   HPI: Patient is a 44 y.o. male seen today for an acute visit for sleep issues and sinus congestion.  He has been experiencing sleep disturbances, initially suspecting sleep apnea due to waking up with a dry nose and throat, and experiencing shortness of breath. He purchased a humidifier approximately four to five days ago, which alleviated these symptoms. He is a smoker.  He describes ongoing sinus congestion, which began about two weeks ago, coinciding with cold weather conditions. He experiences a sensation of water moving in his ears, particularly the right ear, though there is no pain or discharge. His nasal passages and throat remain dry despite drinking plenty of water. No runny nose or sore throat, but he notes occasional sinus headaches. He has been using Tylenol  and sinus medications, which provide only temporary relief. He also uses Flonase  for congestion, but it is not fully effective. He has a history of seasonal allergies, which he manages with Xyzal  5 mg daily as needed.  His current medications include hydrochlorothiazide  25 mg daily for hypertension, omeprazole  40 mg daily for acid reflux, and Crestor  5 mg daily for mixed hyperlipidemia. He primarily drinks water and juice, avoiding sodas.    Past Medical  History:  Diagnosis Date   Allergy    GERD (gastroesophageal reflux disease)    Hyperlipidemia    Hypertension     Past Surgical History:  Procedure Laterality Date   ABDOMINAL SURGERY      Allergies[1]  Outpatient Encounter Medications as of 11/01/2024  Medication Sig   acetaminophen  (TYLENOL ) 325 MG tablet Take 2 tablets (650 mg total) by mouth every 6 (six) hours as needed.   amoxicillin  (AMOXIL ) 875 MG tablet Take 1 tablet (875 mg total) by mouth 2 (two) times daily for 7 days.   cyclobenzaprine  (FLEXERIL ) 10 MG tablet Take 1 tablet (10 mg total) by mouth at bedtime as needed for muscle spasms.   fluticasone  (FLONASE ) 50 MCG/ACT nasal spray Place 1 spray into both nostrils daily as needed for allergies or rhinitis.   hydrochlorothiazide  (HYDRODIURIL ) 25 MG tablet Take 1 tablet (25 mg total) by mouth daily.   levocetirizine (XYZAL ) 5 MG tablet Take 1 tablet (5 mg total) by mouth daily as needed for allergies.   omeprazole  (PRILOSEC) 40 MG capsule Take 1 capsule (40 mg total) by mouth daily.   rosuvastatin  (CRESTOR ) 5 MG tablet Take 1 tablet (5 mg total) by mouth daily.   No facility-administered encounter medications on file as of 11/01/2024.    Review of Systems:  Review of Systems  Constitutional:  Negative for activity change, appetite change and fever.  HENT:  Positive for congestion and sinus pain. Negative for sore throat.  Eyes: Negative.   Cardiovascular:  Negative for chest pain and leg swelling.  Gastrointestinal:  Negative for abdominal distention, diarrhea and vomiting.  Genitourinary:  Negative for dysuria, frequency and urgency.  Skin:  Negative for color change.  Neurological:  Negative for dizziness and headaches.  Psychiatric/Behavioral:  Positive for sleep disturbance. Negative for behavioral problems. The patient is not nervous/anxious.     Health Maintenance  Topic Date Due   Influenza Vaccine  12/26/2024 (Originally 04/28/2024)   Pneumococcal Vaccine (1  of 2 - PCV) 08/01/2025 (Originally 06/06/2000)   Hepatitis B Vaccines 19-59 Average Risk (1 of 3 - 19+ 3-dose series) 08/01/2025 (Originally 06/06/2000)   DTaP/Tdap/Td (2 - Td or Tdap) 01/21/2032   HPV VACCINES (No Doses Required) Completed   Hepatitis C Screening  Completed   HIV Screening  Completed   Meningococcal B Vaccine  Aged Out   COVID-19 Vaccine  Discontinued    Physical Exam: Vitals:   11/01/24 1037  BP: 136/80  Pulse: 100  Temp: (!) 97.3 F (36.3 C)  TempSrc: Temporal  SpO2: 96%  Weight: 280 lb 12.8 oz (127.4 kg)  Height: 6' (1.829 m)   Body mass index is 38.08 kg/m. Physical Exam Constitutional:      Appearance: He is obese.  HENT:     Head: Normocephalic and atraumatic.     Ears:     Comments: Erythema of bilateral tympanic membrane    Mouth/Throat:     Mouth: Mucous membranes are moist.  Eyes:     Conjunctiva/sclera: Conjunctivae normal.  Cardiovascular:     Rate and Rhythm: Normal rate and regular rhythm.     Pulses: Normal pulses.     Heart sounds: Normal heart sounds.  Pulmonary:     Effort: Pulmonary effort is normal.     Breath sounds: Normal breath sounds.  Abdominal:     General: Bowel sounds are normal.     Palpations: Abdomen is soft.  Musculoskeletal:        General: No swelling. Normal range of motion.     Cervical back: Normal range of motion.  Skin:    General: Skin is warm and dry.  Neurological:     General: No focal deficit present.     Mental Status: He is alert and oriented to person, place, and time.  Psychiatric:        Mood and Affect: Mood normal.        Behavior: Behavior normal.        Thought Content: Thought content normal.        Judgment: Judgment normal.     Labs reviewed: Basic Metabolic Panel: Recent Labs    09/18/24 1059  NA 137  K 4.2  CL 103  CO2 27  GLUCOSE 95  BUN 12  CREATININE 1.23  CALCIUM  9.6   Liver Function Tests: Recent Labs    09/18/24 1059  AST 19  ALT 15  BILITOT 0.5  PROT 7.1    No results for input(s): LIPASE, AMYLASE in the last 8760 hours. No results for input(s): AMMONIA in the last 8760 hours. CBC: Recent Labs    09/18/24 1059  WBC 6.3  NEUTROABS 3,333  HGB 15.9  HCT 47.0  MCV 89.9  PLT 340   Lipid Panel: Recent Labs    01/21/24 0912 09/18/24 1059  CHOL 188 190  HDL 40 38*  LDLCALC 125* 127*  TRIG 122 132  CHOLHDL 4.7 5.0*   Lab Results  Component Value Date  HGBA1C 5.7 (H) 09/18/2024    Procedures since last visit: No results found.  Assessment/Plan  1. Acute non-recurrent frontal sinusitis (Primary) -  Symptoms likely due to environmental factors and dry air exposure. - Prescribed amoxicillin  875 mg twice daily for 7 days. - Advised saline nasal spray 2-3 times daily as needed. - Encouraged increased water intake -  continue use of humidifier - amoxicillin  (AMOXIL ) 875 MG tablet; Take 1 tablet (875 mg total) by mouth 2 (two) times daily for 7 days.  Dispense: 14 tablet; Refill: 0  2. Essential hypertension -  Blood pressure well-controlled on current regimen. - Continue hydrochlorothiazide  25 mg daily.  3. Gastroesophageal reflux disease without esophagitis -  Continue omeprazole  40 mg daily.  4. Mixed hyperlipidemia -  Continue Crestor  5 mg daily.     Labs/tests ordered:   None   Return if symptoms worsen or fail to improve.  Angeliz Settlemyre Medina-Vargas, NP     [1]  Allergies Allergen Reactions   Other Itching and Rash   Pineapple     Itching    "

## 2024-11-01 NOTE — Patient Instructions (Signed)

## 2025-01-29 ENCOUNTER — Ambulatory Visit: Admitting: Adult Health
